# Patient Record
Sex: Female | Born: 1965 | Race: Black or African American | Hispanic: No | Marital: Married | Smoking: Never smoker
Health system: Southern US, Community
[De-identification: ages and names within clinical notes are randomized; demographics above are authoritative.]

## PROBLEM LIST (undated history)

## (undated) DIAGNOSIS — I10 Essential (primary) hypertension: Secondary | ICD-10-CM

## (undated) DIAGNOSIS — K8020 Calculus of gallbladder without cholecystitis without obstruction: Secondary | ICD-10-CM

## (undated) DIAGNOSIS — E785 Hyperlipidemia, unspecified: Secondary | ICD-10-CM

## (undated) HISTORY — PX: UTERINE FIBROID SURGERY: SHX826

## (undated) HISTORY — PX: TUBAL LIGATION: SHX77

---

## 2005-10-24 ENCOUNTER — Emergency Department: Payer: Self-pay | Admitting: Emergency Medicine

## 2011-02-12 ENCOUNTER — Ambulatory Visit: Payer: Self-pay | Admitting: Family Medicine

## 2011-03-11 ENCOUNTER — Ambulatory Visit: Payer: Self-pay | Admitting: Family Medicine

## 2011-04-23 ENCOUNTER — Ambulatory Visit: Payer: Self-pay | Admitting: Family Medicine

## 2011-04-30 ENCOUNTER — Ambulatory Visit: Payer: Self-pay

## 2012-12-30 ENCOUNTER — Ambulatory Visit: Payer: Self-pay | Admitting: Family Medicine

## 2013-05-22 ENCOUNTER — Emergency Department: Payer: Self-pay | Admitting: Emergency Medicine

## 2013-05-22 LAB — CBC
HGB: 12.8 g/dL (ref 12.0–16.0)
MCH: 26 pg (ref 26.0–34.0)
MCHC: 32.9 g/dL (ref 32.0–36.0)
Platelet: 340 10*3/uL (ref 150–440)
RBC: 4.92 10*6/uL (ref 3.80–5.20)
RDW: 14.8 % — ABNORMAL HIGH (ref 11.5–14.5)

## 2013-05-22 LAB — TROPONIN I: Troponin-I: <0.02 ng/mL

## 2013-05-22 LAB — COMPREHENSIVE METABOLIC PANEL
Anion Gap: 6 — ABNORMAL LOW (ref 7–16)
Bilirubin,Total: 0.4 mg/dL (ref 0.2–1.0)
Co2: 27 mmol/L (ref 21–32)
EGFR (African American): >60
EGFR (Non-African Amer.): >60
Potassium: 3.6 mmol/L (ref 3.5–5.1)
SGOT(AST): 15 U/L (ref 15–37)
Total Protein: 7.7 g/dL (ref 6.4–8.2)

## 2013-05-23 LAB — URINALYSIS, COMPLETE
Bilirubin,UR: NEGATIVE
Glucose,UR: NEGATIVE mg/dL (ref 0–75)
Specific Gravity: 1.014 (ref 1.003–1.030)
WBC UR: 11 /HPF (ref 0–5)

## 2014-03-08 ENCOUNTER — Ambulatory Visit: Payer: Self-pay | Admitting: Family Medicine

## 2015-03-13 ENCOUNTER — Other Ambulatory Visit: Payer: Self-pay | Admitting: Family Medicine

## 2015-03-13 DIAGNOSIS — Z1231 Encounter for screening mammogram for malignant neoplasm of breast: Secondary | ICD-10-CM

## 2015-03-14 ENCOUNTER — Ambulatory Visit
Admission: RE | Admit: 2015-03-14 | Discharge: 2015-03-14 | Disposition: A | Discharge status: Home / Self Care | Payer: BC Managed Care – PPO | Source: Ambulatory Visit | Attending: Family Medicine | Admitting: Family Medicine

## 2015-03-14 DIAGNOSIS — Z1231 Encounter for screening mammogram for malignant neoplasm of breast: Secondary | ICD-10-CM | POA: Diagnosis not present

## 2016-02-04 ENCOUNTER — Other Ambulatory Visit: Payer: Self-pay | Admitting: Family Medicine

## 2016-02-04 DIAGNOSIS — Z1231 Encounter for screening mammogram for malignant neoplasm of breast: Secondary | ICD-10-CM

## 2016-05-22 ENCOUNTER — Ambulatory Visit
Admission: RE | Admit: 2016-05-22 | Discharge: 2016-05-22 | Disposition: A | Discharge status: Home / Self Care | Payer: BC Managed Care – PPO | Source: Ambulatory Visit | Attending: Family Medicine | Admitting: Family Medicine

## 2016-05-22 DIAGNOSIS — Z1231 Encounter for screening mammogram for malignant neoplasm of breast: Secondary | ICD-10-CM

## 2017-06-01 ENCOUNTER — Other Ambulatory Visit: Payer: Self-pay | Admitting: Family Medicine

## 2017-06-01 DIAGNOSIS — Z1231 Encounter for screening mammogram for malignant neoplasm of breast: Secondary | ICD-10-CM

## 2017-06-02 ENCOUNTER — Ambulatory Visit
Admission: RE | Admit: 2017-06-02 | Discharge: 2017-06-02 | Disposition: A | Discharge status: Home / Self Care | Payer: BC Managed Care – PPO | Source: Ambulatory Visit | Attending: Family Medicine | Admitting: Family Medicine

## 2017-06-02 DIAGNOSIS — Z1231 Encounter for screening mammogram for malignant neoplasm of breast: Secondary | ICD-10-CM | POA: Insufficient documentation

## 2017-09-07 ENCOUNTER — Emergency Department
Admission: EM | Admit: 2017-09-07 | Discharge: 2017-09-08 | Disposition: A | Discharge status: Home / Self Care | Payer: BC Managed Care – PPO | Attending: Emergency Medicine | Admitting: Emergency Medicine

## 2017-09-07 ENCOUNTER — Emergency Department: Payer: BC Managed Care – PPO

## 2017-09-07 DIAGNOSIS — R079 Chest pain, unspecified: Secondary | ICD-10-CM | POA: Insufficient documentation

## 2017-09-07 HISTORY — DX: Calculus of gallbladder without cholecystitis without obstruction: K80.20

## 2017-09-07 LAB — BASIC METABOLIC PANEL
Anion gap: 9 (ref 5–15)
BUN: 13 mg/dL (ref 6–20)
CHLORIDE: 102 mmol/L (ref 101–111)
CO2: 27 mmol/L (ref 22–32)
CREATININE: 0.85 mg/dL (ref 0.44–1.00)
Calcium: 8.3 mg/dL — ABNORMAL LOW (ref 8.9–10.3)
GFR calc Af Amer: >60 mL/min (ref >60)
GFR calc non Af Amer: >60 mL/min (ref >60)
Glucose, Bld: 104 mg/dL — ABNORMAL HIGH (ref 65–99)
POTASSIUM: 2.9 mmol/L — AB (ref 3.5–5.1)
SODIUM: 138 mmol/L (ref 135–145)

## 2017-09-07 LAB — TROPONIN I: Troponin I: <0.03 ng/mL (ref <0.03)

## 2017-09-07 LAB — CBC
HEMATOCRIT: 39 % (ref 35.0–47.0)
Hemoglobin: 12.8 g/dL (ref 12.0–16.0)
MCH: 27 pg (ref 26.0–34.0)
MCHC: 32.9 g/dL (ref 32.0–36.0)
MCV: 82.1 fL (ref 80.0–100.0)
PLATELETS: 348 10*3/uL (ref 150–440)
RBC: 4.75 MIL/uL (ref 3.80–5.20)
RDW: 14.8 % — ABNORMAL HIGH (ref 11.5–14.5)
WBC: 8.9 10*3/uL (ref 3.6–11.0)

## 2017-09-07 MED ORDER — ASPIRIN 81 MG PO CHEW
324.0000 mg | CHEWABLE_TABLET | Freq: Once | ORAL | Status: AC
  Administered 2017-09-07: 324 mg via ORAL
  Filled 2017-09-07: qty 4

## 2017-09-07 MED ORDER — GI COCKTAIL ~~LOC~~
30.0000 mL | Freq: Once | ORAL | Status: DC
  Filled 2017-09-07: qty 30

## 2017-09-07 MED ORDER — NITROGLYCERIN 0.4 MG SL SUBL
0.4000 mg | SUBLINGUAL_TABLET | SUBLINGUAL | Status: DC | PRN
  Administered 2017-09-07 – 2017-09-08 (×2): 0.4 mg via SUBLINGUAL
  Filled 2017-09-07: qty 1

## 2017-09-07 NOTE — ED Triage Notes (Signed)
Patient c/o medial chest pain, radiating to right arm beginng approx 2 hours ago. Patient describes the pain as pressure. Patient reports associated symptoms of SOB, and weakness.

## 2017-09-07 NOTE — ED Provider Notes (Signed)
[facility] Emergency Department Provider Note   ____________________________________________   First MD Initiated Contact with Patient 09/07/17 2304     (approximate)  I have reviewed the triage vital signs and the nursing notes.   HISTORY  Chief Complaint Chest Pain    HPI Joy Dunlap is a 52 y.o. female who comes into the hospital today with some chest pains.  The patient states that it started around 9 PM.  She was not doing anything just sitting.  The pain is on the right side of her chest and goes under her breasts.  Occasionally goes to her arm into her back.  The patient has never had this before.  She reports that it feels sharp and dull.  Nothing seems to make it better or worse but the patient did not take anything at home.  She states the will calm down and then become sharp again.  Her pain is currently a 6 out of 10 in intensity.  The patient had some shortness of breath but denies any pain with deep inspiration.  She has had no nausea no vomiting no sweats.  She has had some mild lightheadedness.  The patient is here today for evaluation of this pain.   Past Medical History:  Diagnosis Date  . Gallstones     There are no active problems to display for this patient.   History reviewed. No pertinent surgical history.  Prior to Admission medications   Not on File    Allergies Flagyl [metronidazole]  Family History  Problem Relation Age of Onset  . Breast cancer Maternal Aunt 62  . Breast cancer Cousin        mat cousin    Social History Social History   Tobacco Use  . Smoking status: Never Smoker  . Smokeless tobacco: Never Used  Substance Use Topics  . Alcohol use: No    Frequency: Never  . Drug use: No    Review of Systems  Constitutional: No fever/chills Eyes: No visual changes. ENT: No sore throat. Cardiovascular:  chest pain. Respiratory:  shortness of breath. Gastrointestinal: No abdominal pain.  No  nausea, no vomiting.  No diarrhea.  No constipation. Genitourinary: Negative for dysuria. Musculoskeletal: Negative for back pain. Skin: Negative for rash. Neurological: Lightheadedness  ____________________________________________   PHYSICAL EXAM:  VITAL SIGNS: ED Triage Vitals  Enc Vitals Group     BP 09/07/17 2223 (!) 200/118     Pulse Rate 09/07/17 2223 93     Resp --      Temp 09/07/17 2223 100.1 F (37.8 C)     Temp Source 09/07/17 2223 Oral     SpO2 09/07/17 2223 97 %     Weight 09/07/17 2221 230 lb (104.3 kg)     Height 09/07/17 2221 5\' 6"  (1.676 m)     Head Circumference --      Peak Flow --      Pain Score 09/07/17 2221 6     Pain Loc --      Pain Edu? --      Excl. in [place]? --     Constitutional: Alert and oriented. Well appearing and in moderate distress. Eyes: Conjunctivae are normal. PERRL. EOMI. Head: Atraumatic. Nose: No congestion/rhinnorhea. Mouth/Throat: Mucous membranes are moist.  Oropharynx non-erythematous. Cardiovascular: Normal rate, regular rhythm. Grossly normal heart sounds.  Good peripheral circulation. Respiratory: Normal respiratory effort.  No retractions. Lungs CTAB. Gastrointestinal: Soft and nontender. No distention.  Positive bowel sounds Musculoskeletal:  No lower extremity tenderness nor edema.   Neurologic:  Normal speech and language.  Skin:  Skin is warm, dry and intact.  Psychiatric: Mood and affect are normal.   ____________________________________________   LABS (all labs ordered are listed, but only abnormal results are displayed)  Labs Reviewed  BASIC METABOLIC PANEL - Abnormal; Notable for the following components:      Result Value   Potassium 2.9 (*)    Glucose, Bld 104 (*)    Calcium 8.3 (*)    All other components within normal limits  CBC - Abnormal; Notable for the following components:   RDW 14.8 (*)    All other components within normal limits  TROPONIN I  TROPONIN I  POCT PREGNANCY, URINE  POC URINE  PREG, ED   ____________________________________________  EKG  ED ECG REPORT I, Loney Hering, the attending physician, personally viewed and interpreted this ECG.   Date: 09/07/2017  EKG Time: 2222  Rate: 84  Rhythm: normal sinus rhythm  Axis: normal  Intervals:none  ST&T Change: diffuse t wave flattening  ____________________________________________  RADIOLOGY  ED MD interpretation: Chest x-ray: Negative  Official radiology report(s): Dg Chest 2 View  Result Date: 09/07/2017 CLINICAL DATA:  Acute onset of central chest pain, radiating to the right arm. Shortness of breath and generalized weakness. EXAM: CHEST  2 VIEW COMPARISON:  None. FINDINGS: The lungs are well-aerated and clear. There is no evidence of focal opacification, pleural effusion or pneumothorax. The heart is normal in size; the mediastinal contour is within normal limits. No acute osseous abnormalities are seen. Clips are noted within the right upper quadrant, reflecting prior cholecystectomy. IMPRESSION: No acute cardiopulmonary process seen. Electronically Signed   By: Garald Balding M.D.   On: 09/07/2017 22:41    ____________________________________________   PROCEDURES  Procedure(s) performed: None  Procedures  Critical Care performed: No  ____________________________________________   INITIAL IMPRESSION / ASSESSMENT AND PLAN / ED COURSE  As part of my medical decision making, I reviewed the following data within the electronic MEDICAL RECORD NUMBER Notes from prior ED visits and [place] Controlled Substance Database   This is a 52 year old female who comes into the hospital today with some chest pain shortness of breath and lightheadedness.  My differential diagnosis includes acute coronary syndrome, hypertensive urgency, noncardiac chest pain  I did give the patient some nitroglycerin and aspirin.  We did check some blood work on the patient.  Her initial troponin and CBC were unremarkable.  The  patient's K returned at 2.9.  The patient also had a chest x-ray which was negative.  I will reassess the patient and repeat 3-hour troponin.  The patient's blood pressure was elevated but she does not admit to having problems with her blood pressure.  The patient will be reassessed.  Clinical Course as of Sep 08 318  Tue Sep 08, 2017  0036 His pain is improved.  Her blood pressure is also improved.  We will reassess the patient and repeat her troponin in approximately 30 minutes.  [AW]    Clinical Course User Index [AW] Loney Hering, MD    Patient's pain is still improved.  Her repeat troponin was negative.  The patient will be discharged home to follow-up with cardiology. ____________________________________________   FINAL CLINICAL IMPRESSION(S) / ED DIAGNOSES  Final diagnoses:  Chest pain, unspecified type     ED Discharge Orders    None       Note:  This document was prepared  using Systems analyst and may include unintentional dictation errors.    Loney Hering, MD 09/08/17 [phone]

## 2017-09-07 NOTE — ED Notes (Signed)
Pt states she can not tolerate the GI cocktail, pt tasted medication and began to dry heave. Pt states she is not able to drink the rest of the medication.

## 2017-09-08 ENCOUNTER — Telehealth: Payer: Self-pay

## 2017-09-08 DIAGNOSIS — R079 Chest pain, unspecified: Secondary | ICD-10-CM | POA: Diagnosis not present

## 2017-09-08 LAB — TROPONIN I: Troponin I: <0.03 ng/mL (ref <0.03)

## 2017-09-08 LAB — POCT PREGNANCY, URINE: Preg Test, Ur: NEGATIVE

## 2017-09-08 MED ORDER — POTASSIUM CHLORIDE CRYS ER 20 MEQ PO TBCR
40.0000 meq | EXTENDED_RELEASE_TABLET | Freq: Once | ORAL | Status: AC
Start: 2017-09-08 — End: 2017-09-08
  Administered 2017-09-08: 40 meq via ORAL
  Filled 2017-09-08: qty 2

## 2017-09-08 NOTE — Discharge Instructions (Signed)
Please follow up with cardiology for further re evaluation of your chest pain as well as evaluation of your blood pressure.

## 2017-09-08 NOTE — ED Notes (Signed)

## 2017-09-08 NOTE — Telephone Encounter (Signed)
Called patient she was seen in ED 09/07/17 for CP  She states she does not want to schedule an FU appointment at this time She will see PCP first then call us if needed.   Nothing else needed.

## 2018-06-28 ENCOUNTER — Other Ambulatory Visit: Payer: Self-pay | Admitting: Family Medicine

## 2018-06-28 DIAGNOSIS — Z1231 Encounter for screening mammogram for malignant neoplasm of breast: Secondary | ICD-10-CM

## 2018-07-06 ENCOUNTER — Encounter (INDEPENDENT_AMBULATORY_CARE_PROVIDER_SITE_OTHER): Payer: Self-pay

## 2018-07-06 ENCOUNTER — Ambulatory Visit
Admission: RE | Admit: 2018-07-06 | Discharge: 2018-07-06 | Disposition: A | Discharge status: Home / Self Care | Payer: BC Managed Care – PPO | Source: Ambulatory Visit | Attending: Family Medicine | Admitting: Family Medicine

## 2018-07-06 DIAGNOSIS — Z1231 Encounter for screening mammogram for malignant neoplasm of breast: Secondary | ICD-10-CM | POA: Diagnosis present

## 2018-09-14 ENCOUNTER — Observation Stay
Admission: EM | Admit: 2018-09-14 | Discharge: 2018-09-18 | Disposition: A | Discharge status: Home / Self Care | Payer: BC Managed Care – PPO | Attending: Internal Medicine | Admitting: Internal Medicine

## 2018-09-14 ENCOUNTER — Encounter: Payer: Self-pay | Admitting: Emergency Medicine

## 2018-09-14 ENCOUNTER — Emergency Department: Payer: BC Managed Care – PPO

## 2018-09-14 ENCOUNTER — Other Ambulatory Visit: Payer: Self-pay

## 2018-09-14 DIAGNOSIS — Z7951 Long term (current) use of inhaled steroids: Secondary | ICD-10-CM | POA: Insufficient documentation

## 2018-09-14 DIAGNOSIS — R109 Unspecified abdominal pain: Secondary | ICD-10-CM

## 2018-09-14 DIAGNOSIS — E669 Obesity, unspecified: Secondary | ICD-10-CM | POA: Diagnosis not present

## 2018-09-14 DIAGNOSIS — R1084 Generalized abdominal pain: Principal | ICD-10-CM

## 2018-09-14 DIAGNOSIS — I776 Arteritis, unspecified: Secondary | ICD-10-CM

## 2018-09-14 DIAGNOSIS — E785 Hyperlipidemia, unspecified: Secondary | ICD-10-CM | POA: Diagnosis not present

## 2018-09-14 DIAGNOSIS — Z86010 Personal history of colonic polyps: Secondary | ICD-10-CM | POA: Insufficient documentation

## 2018-09-14 DIAGNOSIS — Z9049 Acquired absence of other specified parts of digestive tract: Secondary | ICD-10-CM | POA: Insufficient documentation

## 2018-09-14 DIAGNOSIS — R112 Nausea with vomiting, unspecified: Secondary | ICD-10-CM | POA: Diagnosis not present

## 2018-09-14 DIAGNOSIS — R8281 Pyuria: Secondary | ICD-10-CM | POA: Insufficient documentation

## 2018-09-14 DIAGNOSIS — K2950 Unspecified chronic gastritis without bleeding: Secondary | ICD-10-CM | POA: Diagnosis not present

## 2018-09-14 DIAGNOSIS — B9681 Helicobacter pylori [H. pylori] as the cause of diseases classified elsewhere: Secondary | ICD-10-CM | POA: Insufficient documentation

## 2018-09-14 DIAGNOSIS — Z79899 Other long term (current) drug therapy: Secondary | ICD-10-CM | POA: Diagnosis not present

## 2018-09-14 DIAGNOSIS — Z87898 Personal history of other specified conditions: Secondary | ICD-10-CM | POA: Insufficient documentation

## 2018-09-14 DIAGNOSIS — Z6837 Body mass index (BMI) 37.0-37.9, adult: Secondary | ICD-10-CM | POA: Diagnosis not present

## 2018-09-14 DIAGNOSIS — I10 Essential (primary) hypertension: Secondary | ICD-10-CM | POA: Diagnosis not present

## 2018-09-14 DIAGNOSIS — K319 Disease of stomach and duodenum, unspecified: Secondary | ICD-10-CM | POA: Insufficient documentation

## 2018-09-14 DIAGNOSIS — K317 Polyp of stomach and duodenum: Secondary | ICD-10-CM

## 2018-09-14 DIAGNOSIS — K5900 Constipation, unspecified: Secondary | ICD-10-CM | POA: Diagnosis not present

## 2018-09-14 DIAGNOSIS — R3129 Other microscopic hematuria: Secondary | ICD-10-CM | POA: Diagnosis not present

## 2018-09-14 HISTORY — DX: Hyperlipidemia, unspecified: E78.5

## 2018-09-14 HISTORY — DX: Essential (primary) hypertension: I10

## 2018-09-14 LAB — COMPREHENSIVE METABOLIC PANEL
ALBUMIN: 3.9 g/dL (ref 3.5–5.0)
ALK PHOS: 62 U/L (ref 38–126)
ALT: 18 U/L (ref 0–44)
AST: 16 U/L (ref 15–41)
Anion gap: 10 (ref 5–15)
BILIRUBIN TOTAL: 0.5 mg/dL (ref 0.3–1.2)
BUN: 14 mg/dL (ref 6–20)
CALCIUM: 8.7 mg/dL — AB (ref 8.9–10.3)
CO2: 27 mmol/L (ref 22–32)
Chloride: 103 mmol/L (ref 98–111)
Creatinine, Ser: 0.99 mg/dL (ref 0.44–1.00)
GFR calc Af Amer: >60 mL/min (ref >60)
GFR calc non Af Amer: >60 mL/min (ref >60)
GLUCOSE: 112 mg/dL — AB (ref 70–99)
Potassium: 3.8 mmol/L (ref 3.5–5.1)
Sodium: 140 mmol/L (ref 135–145)
TOTAL PROTEIN: 7.7 g/dL (ref 6.5–8.1)

## 2018-09-14 LAB — CBC WITH DIFFERENTIAL/PLATELET
Abs Immature Granulocytes: 0.02 K/uL (ref 0.00–0.07)
Basophils Absolute: 0 K/uL (ref 0.0–0.1)
Basophils Relative: 0 %
Eosinophils Absolute: 0.2 K/uL (ref 0.0–0.5)
Eosinophils Relative: 2 %
HCT: 40.9 % (ref 36.0–46.0)
Hemoglobin: 12.8 g/dL (ref 12.0–15.0)
Immature Granulocytes: 0 %
Lymphocytes Relative: 20 %
Lymphs Abs: 2.1 K/uL (ref 0.7–4.0)
MCH: 26.3 pg (ref 26.0–34.0)
MCHC: 31.3 g/dL (ref 30.0–36.0)
MCV: 84 fL (ref 80.0–100.0)
Monocytes Absolute: 0.5 K/uL (ref 0.1–1.0)
Monocytes Relative: 5 %
Neutro Abs: 7.8 K/uL — ABNORMAL HIGH (ref 1.7–7.7)
Neutrophils Relative %: 73 %
Platelets: 375 K/uL (ref 150–400)
RBC: 4.87 MIL/uL (ref 3.87–5.11)
RDW: 14 % (ref 11.5–15.5)
WBC: 10.6 K/uL — ABNORMAL HIGH (ref 4.0–10.5)
nRBC: 0 % (ref 0.0–0.2)

## 2018-09-14 LAB — URINALYSIS, COMPLETE (UACMP) WITH MICROSCOPIC
Bilirubin Urine: NEGATIVE
Glucose, UA: NEGATIVE mg/dL
Ketones, ur: NEGATIVE mg/dL
Nitrite: NEGATIVE
Protein, ur: NEGATIVE mg/dL
Specific Gravity, Urine: 1.015 (ref 1.005–1.030)
pH: 6 (ref 5.0–8.0)

## 2018-09-14 LAB — LIPASE, BLOOD: Lipase: 34 U/L (ref 11–51)

## 2018-09-14 LAB — POCT PREGNANCY, URINE: Preg Test, Ur: NEGATIVE

## 2018-09-14 MED ORDER — ONDANSETRON HCL 4 MG/2ML IJ SOLN
4.0000 mg | Freq: Once | INTRAMUSCULAR | Status: AC | PRN
  Administered 2018-09-14: 4 mg via INTRAVENOUS
  Filled 2018-09-14: qty 2

## 2018-09-14 MED ORDER — IOPAMIDOL (ISOVUE-370) INJECTION 76%
125.0000 mL | Freq: Once | INTRAVENOUS | Status: AC | PRN
  Administered 2018-09-14: 125 mL via INTRAVENOUS

## 2018-09-14 MED ORDER — FENTANYL CITRATE (PF) 100 MCG/2ML IJ SOLN
50.0000 ug | INTRAMUSCULAR | Status: DC | PRN
  Administered 2018-09-14: 50 ug via INTRAVENOUS
  Filled 2018-09-14: qty 2

## 2018-09-14 NOTE — ED Provider Notes (Signed)
[facility] Emergency Department Provider Note _______________________   First MD Initiated Contact with Patient 09/14/18 2339     (approximate)  I have reviewed the triage vital signs and the nursing notes.   HISTORY  Chief Complaint Abdominal Pain    HPI Joy Dunlap is a 53 y.o. female with below list of chronic medical conditions including hypertension and gallstones presents to the emergency department with 10 out of 10 epigastric abdominal pain with associated nausea which began yesterday.  Patient states that pain is progressively worsened.  Patient denies any vomiting however does admit to retching.  Patient denies any diarrhea no constipation.  Patient denies any fever.  Patient denies any urinary symptoms.   Past Medical History:  Diagnosis Date  . Gallstones   . Hypertension     Patient Active Problem List   Diagnosis Date Noted  . Abdominal pain 09/15/2018  . HTN (hypertension) 09/15/2018    Past Surgical History:  Procedure Laterality Date  . TUBAL LIGATION    . UTERINE FIBROID SURGERY      Prior to Admission medications   Medication Sig Start Date End Date Taking? Authorizing Provider  amLODipine (NORVASC) 5 MG tablet Take 5 mg by mouth daily. 06/16/18 06/16/19 Yes [provider]  atorvastatin (LIPITOR) 10 MG tablet Take 10 mg by mouth daily. 07/08/18 07/08/19 Yes [provider]  fluticasone (FLONASE) 50 MCG/ACT nasal spray Place 2 sprays into the nose daily. 07/07/18 07/07/19 Yes [provider]  ipratropium (ATROVENT) 0.03 % nasal spray Place 2 sprays into the nose 3 (three) times daily as needed. 06/04/18 06/04/19 Yes [provider]  triamterene-hydrochlorothiazide (DYAZIDE) 37.5-25 MG capsule Take 1 capsule by mouth every morning. 06/16/18 06/16/19 Yes [provider]    Allergies Flagyl [metronidazole]  Family History  Problem Relation Age of Onset  . Breast cancer  Maternal Aunt 62  . Breast cancer Cousin        2 mat cousins    Social History Social History   Tobacco Use  . Smoking status: Never Smoker  . Smokeless tobacco: Never Used  Substance Use Topics  . Alcohol use: No    Frequency: Never  . Drug use: No    Review of Systems Constitutional: No fever/chills Eyes: No visual changes. ENT: No sore throat. Cardiovascular: Denies chest pain. Respiratory: Denies shortness of breath. Gastrointestinal: Positive for abdominal pain and nausea, no vomiting.  No diarrhea.  No constipation. Genitourinary: Negative for dysuria. Musculoskeletal: Negative for neck pain.  Negative for back pain. Integumentary: Negative for rash. Neurological: Negative for headaches, focal weakness or numbness.   ____________________________________________   PHYSICAL EXAM:  VITAL SIGNS: ED Triage Vitals  Enc Vitals Group     BP 09/14/18 2119 (!) 175/95     Pulse Rate 09/14/18 2119 87     Resp 09/14/18 2119 20     Temp 09/14/18 2119 100.1 F (37.8 C)     Temp Source 09/14/18 2119 Oral     SpO2 09/14/18 2119 95 %     Weight 09/14/18 2118 104.3 kg (230 lb)     Height 09/14/18 2118 1.676 m (5\' 6" )     Head Circumference --      Peak Flow --      Pain Score 09/14/18 2118 10     Pain Loc --      Pain Edu? --      Excl. in [place]? --     Constitutional: Alert and oriented.  apparent discomfort  eyes: Conjunctivae are normal.  Mouth/Throat: Mucous membranes are moist.  Oropharynx non-erythematous. Neck: No stridor.  Cardiovascular: Normal rate, regular rhythm. Good peripheral circulation. Grossly normal heart sounds. Respiratory: Normal respiratory effort.  No retractions. Lungs CTAB. Gastrointestinal: Epigastric discomfort with palpation.. No distention.  Musculoskeletal: No lower extremity tenderness nor edema. No gross deformities of extremities. Neurologic:  Normal speech and language. No gross focal neurologic deficits are appreciated.  Skin:  Skin  is warm, dry and intact. No rash noted. Psychiatric: Mood and affect are normal. Speech and behavior are normal.  ____________________________________________   LABS (all labs ordered are listed, but only abnormal results are displayed)  Labs Reviewed  CBC WITH DIFFERENTIAL/PLATELET - Abnormal; Notable for the following components:      Result Value   WBC 10.6 (*)    Neutro Abs 7.8 (*)    All other components within normal limits  COMPREHENSIVE METABOLIC PANEL - Abnormal; Notable for the following components:   Glucose, Bld 112 (*)    Calcium 8.7 (*)    All other components within normal limits  URINALYSIS, COMPLETE (UACMP) WITH MICROSCOPIC - Abnormal; Notable for the following components:   Color, Urine YELLOW (*)    APPearance CLEAR (*)    Hgb urine dipstick MODERATE (*)    Leukocytes,Ua TRACE (*)    Bacteria, UA RARE (*)    All other components within normal limits  CULTURE, BLOOD (ROUTINE X 2)  CULTURE, BLOOD (ROUTINE X 2)  LIPASE, BLOOD  POCT PREGNANCY, URINE   ____________________  RADIOLOGY I, [place] N Takyia Sindt, personally viewed and evaluated these images (plain radiographs) as part of my medical decision making, as well as reviewing the written report by the radiologist.  ED MD interpretation: CT renal study performed before my evaluation revealed mild stranding surrounding the origins of the celiac axis and superior mesenteric artery may reflect mild vasculitis with recommendation for CT angiogram per radiologist.  CT angiogram findings consistent with that of the previous CT revealing patent vessels however persistent inflammatory stranding along the ventral aorta at the SMA origin mildly present at the celiac axis and renal origins.  Official radiology report(s): Ct Renal Stone Study  Result Date: 09/14/2018 CLINICAL DATA:  Hematuria with abdominal pain EXAM: CT ABDOMEN AND PELVIS WITHOUT CONTRAST TECHNIQUE: Multidetector CT imaging of the abdomen and pelvis was  performed following the standard protocol without IV contrast. COMPARISON:  CT abdomen pelvis 10/24/2005 FINDINGS: LOWER CHEST: There is no basilar pleural or apical pericardial effusion. HEPATOBILIARY: 2.8 cm cyst in the left hepatic lobe, increased in size from prior examination. No other focal liver lesions. Normal contours. Status post cholecystectomy. PANCREAS: The pancreatic parenchymal contours are normal and there is no ductal dilatation. There is no peripancreatic fluid collection. SPLEEN: Normal. ADRENALS/URINARY TRACT: --Adrenal glands: Normal. --Right kidney/ureter: No hydronephrosis, nephroureterolithiasis, perinephric stranding or solid renal mass. --Left kidney/ureter: No hydronephrosis, nephroureterolithiasis, perinephric stranding or solid renal mass. --Urinary bladder: Normal for degree of distention STOMACH/BOWEL: --Stomach/Duodenum: There is no hiatal hernia or other gastric abnormality. The duodenal course and caliber are normal. --Small bowel: No dilatation or inflammation. --Colon: No focal abnormality. --Appendix: Normal. VASCULAR/LYMPHATIC: There is mild stranding surrounding the origins of the celiac axis and superior mesenteric artery. Otherwise normal appearance of the major abdominal vessels. No abdominal or pelvic lymphadenopathy. REPRODUCTIVE: Multiple partially calcified uterine fibroids, measuring up to 3 cm. Calcification is new compared to the prior CT. Normal adnexa. MUSCULOSKELETAL. No bony spinal canal stenosis or focal osseous abnormality.  OTHER: None. IMPRESSION: 1. Mild stranding surrounding the origins of the celiac axis and superior mesenteric artery, which may indicate a mild vasculitis. Vascular patency could be confirmed with CT angiography. 2. No obstructive uropathy or nephrolithiasis. 3. Multiple calcified uterine fibroids. Electronically Signed   By: Ulyses Jarred M.D.   On: 09/14/2018 23:09   Ct Angio Abd/pel W And/or Wo Contrast  Result Date:  09/15/2018 CLINICAL DATA:  52 year old female with abnormal noncontrast CT Abdomen and Pelvis earlier. Hematuria and abdominal pain. Query celiac and SMA vasculitis. EXAM: CTA ABDOMEN AND PELVIS wITHOUT AND WITH CONTRAST TECHNIQUE: Multidetector CT imaging of the abdomen and pelvis was performed using the standard protocol during bolus administration of intravenous contrast. Multiplanar reconstructed images and MIPs were obtained and reviewed to evaluate the vascular anatomy. CONTRAST:  161mL ISOVUE-370 IOPAMIDOL (ISOVUE-370) INJECTION 76% COMPARISON:  CT Abdomen and Pelvis 09/14/2018. FINDINGS: VASCULAR Aorta: Patent without atherosclerosis, stenosis, wall thickening, aneurysm or dissection. Celiac: Patent without atherosclerosis or stenosis. Mild fat stranding about the celiac trunk. SMA: More moderate fat stranding about the proximal SMA which is mildly narrow it but remains patent (series 8, image 71 and series 9, image 120. No wall thickening. No atherosclerosis identified. SMA branches appear patent. Renals: Patent. Mildly bordered by the fat stranding around the SMA. IMA: Patent and within normal limits. Bilateral iliac arteries and proximal femoral arteries appear normal. Veins: On delayed images the portal venous system is patent. No abnormality of the SMV is identified. Review of the MIP images confirms the above findings. NON-VASCULAR Lower chest: Stable mild cardiomegaly and lung base atelectasis. Hepatobiliary: Simple fluid density central hepatic cyst. Surgically absent gallbladder. Pancreas: Pancreatic enhancement is within normal limits. Spleen: Negative. Adrenals/Urinary Tract: Normal adrenal glands. Bilateral renal enhancement is symmetric and within normal limits. Diminutive and unremarkable urinary bladder. Stomach/Bowel: Stable. Diverticulosis of the descending and sigmoid colon without active inflammation. Retained stool elsewhere. Negative retrocecal appendix. No dilated small bowel no free  air, free fluid. Lymphatic: No lymphadenopathy. Reproductive: Stable, multiple calcified fibroids. Other: No pelvic free fluid. Musculoskeletal: No acute osseous abnormality identified. Mild chronic appearing trauma to the anterior left pubic symphysis with dystrophic calcification of the muscle. IMPRESSION: VASCULAR 1. Persistent inflammatory stranding along the ventral Aorta at the SMA origin, mildly present at the celiac and renal origins. No Aortic or SMA wall thickening, dissection, atherosclerosis, or occlusion. 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 Early retroperitoneal fibrosis is felt less likely. 2. SMV and portal venous system appear normal. NON-VASCULAR Stable from the earlier CT Abdomen and Pelvis.  No new abnormality. Electronically Signed   By: Genevie Ann M.D.   On: 09/15/2018 00:47      Procedures   ____________________________________________   INITIAL IMPRESSION / ASSESSMENT AND PLAN / ED COURSE  As part of my medical decision making, I reviewed the following data within the electronic MEDICAL RECORD NUMBER   53 year old female presented with above-stated history and physical exam secondary to epigastric abdominal discomfort with associated nausea.  CT scan performed to evaluate for intra-abdominal pathology including diverticulitis gastritis gastric ulcer etc. 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  Patient given Decadron 20 mg IV.  Patient also received multiple doses of IV morphine as pain reoccurred after the initial dose.  Patient discussed with Dr. Lucky Cowboy who agreed with [facility] thus far including IV Decadron.  Dr. 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[place])     MEDICATIONS GIVEN DURING THIS VISIT:  Medications  fentaNYL  (SUBLIMAZE) injection 50 mcg (50 mcg Intravenous Given 09/14/18 2231)  ondansetron (ZOFRAN) injection 4 mg (4 mg Intravenous Given 09/14/18 2230)  iopamidol (ISOVUE-370) 76 % injection 125 mL (125 mLs Intravenous Contrast Given 09/14/18 2359)  dexamethasone (DECADRON) injection 20 mg (20 mg Intravenous Given 09/15/18 0135)  morphine 2 MG/ML injection 2 mg (2 mg Intravenous Given 09/15/18 0134)     ED Discharge Orders    None       Note:  This document was prepared using Dragon voice recognition software and may include unintentional dictation errors.   Gregor Hams, MD 09/15/18 [phone]

## 2018-09-14 NOTE — ED Notes (Signed)
Pt presentation discussed with Burlene Arnt, MD. Order received for renal CT.

## 2018-09-14 NOTE — ED Triage Notes (Addendum)
Patient ambulatory to triage with steady gait, without difficulty, appears uncomfortable; pt reports since yesterday having lower abd pain, nonradiating accomp by nausea; denies hx of same

## 2018-09-15 ENCOUNTER — Encounter: Payer: Self-pay | Admitting: Internal Medicine

## 2018-09-15 ENCOUNTER — Other Ambulatory Visit: Payer: Self-pay

## 2018-09-15 DIAGNOSIS — R1084 Generalized abdominal pain: Secondary | ICD-10-CM | POA: Diagnosis not present

## 2018-09-15 DIAGNOSIS — D259 Leiomyoma of uterus, unspecified: Secondary | ICD-10-CM

## 2018-09-15 DIAGNOSIS — I10 Essential (primary) hypertension: Secondary | ICD-10-CM

## 2018-09-15 DIAGNOSIS — E785 Hyperlipidemia, unspecified: Secondary | ICD-10-CM | POA: Diagnosis present

## 2018-09-15 DIAGNOSIS — I776 Arteritis, unspecified: Secondary | ICD-10-CM | POA: Diagnosis not present

## 2018-09-15 LAB — SEDIMENTATION RATE: Sed Rate: 41 mm/hr — ABNORMAL HIGH (ref 0–30)

## 2018-09-15 LAB — CBC
HCT: 40.5 % (ref 36.0–46.0)
Hemoglobin: 12.8 g/dL (ref 12.0–15.0)
MCH: 26.5 pg (ref 26.0–34.0)
MCHC: 31.6 g/dL (ref 30.0–36.0)
MCV: 83.9 fL (ref 80.0–100.0)
Platelets: 380 10*3/uL (ref 150–400)
RBC: 4.83 MIL/uL (ref 3.87–5.11)
RDW: 14.1 % (ref 11.5–15.5)
WBC: 7.5 10*3/uL (ref 4.0–10.5)
nRBC: 0 % (ref 0.0–0.2)

## 2018-09-15 LAB — BASIC METABOLIC PANEL
Anion gap: 7 (ref 5–15)
BUN: 12 mg/dL (ref 6–20)
CALCIUM: 8.5 mg/dL — AB (ref 8.9–10.3)
CO2: 27 mmol/L (ref 22–32)
Chloride: 104 mmol/L (ref 98–111)
Creatinine, Ser: 0.83 mg/dL (ref 0.44–1.00)
GFR calc Af Amer: >60 mL/min (ref >60)
GFR calc non Af Amer: >60 mL/min (ref >60)
GLUCOSE: 139 mg/dL — AB (ref 70–99)
Potassium: 3.2 mmol/L — ABNORMAL LOW (ref 3.5–5.1)
Sodium: 138 mmol/L (ref 135–145)

## 2018-09-15 LAB — C-REACTIVE PROTEIN: CRP: 2 mg/dL — AB (ref <1.0)

## 2018-09-15 MED ORDER — ACETAMINOPHEN 650 MG RE SUPP: 650.0000 mg | Freq: Four times a day (QID) | RECTAL | Status: DC | PRN

## 2018-09-15 MED ORDER — ENOXAPARIN SODIUM 40 MG/0.4ML ~~LOC~~ SOLN
40.0000 mg | SUBCUTANEOUS | Status: DC
  Filled 2018-09-15 (×2): qty 0.4

## 2018-09-15 MED ORDER — ACETAMINOPHEN 325 MG PO TABS: 650.0000 mg | ORAL_TABLET | Freq: Four times a day (QID) | ORAL | Status: DC | PRN

## 2018-09-15 MED ORDER — SODIUM CHLORIDE 0.9 % IV SOLN
INTRAVENOUS | Status: AC
  Administered 2018-09-15 (×2): via INTRAVENOUS

## 2018-09-15 MED ORDER — ATORVASTATIN CALCIUM 20 MG PO TABS
10.0000 mg | ORAL_TABLET | Freq: Every day | ORAL | Status: DC
  Administered 2018-09-15 – 2018-09-18 (×3): 10 mg via ORAL
  Filled 2018-09-15 (×3): qty 1

## 2018-09-15 MED ORDER — AMLODIPINE BESYLATE 5 MG PO TABS
5.0000 mg | ORAL_TABLET | Freq: Every day | ORAL | Status: DC
  Administered 2018-09-15 – 2018-09-18 (×4): 5 mg via ORAL
  Filled 2018-09-15 (×4): qty 1

## 2018-09-15 MED ORDER — MORPHINE SULFATE (PF) 2 MG/ML IV SOLN
2.0000 mg | INTRAVENOUS | Status: DC | PRN
  Administered 2018-09-17: 16:00:00 2 mg via INTRAVENOUS
  Filled 2018-09-15: qty 1

## 2018-09-15 MED ORDER — TRIAMTERENE-HCTZ 37.5-25 MG PO TABS
1.0000 | ORAL_TABLET | Freq: Every morning | ORAL | Status: DC
  Administered 2018-09-15 – 2018-09-18 (×4): 1 via ORAL
  Filled 2018-09-15 (×4): qty 1

## 2018-09-15 MED ORDER — DEXAMETHASONE SODIUM PHOSPHATE 10 MG/ML IJ SOLN
20.0000 mg | Freq: Once | INTRAMUSCULAR | Status: AC
  Administered 2018-09-15: 20 mg via INTRAVENOUS
  Filled 2018-09-15: qty 2

## 2018-09-15 MED ORDER — ONDANSETRON HCL 4 MG/2ML IJ SOLN: 4.0000 mg | Freq: Four times a day (QID) | INTRAMUSCULAR | Status: DC | PRN

## 2018-09-15 MED ORDER — ONDANSETRON HCL 4 MG PO TABS: 4.0000 mg | ORAL_TABLET | Freq: Four times a day (QID) | ORAL | Status: DC | PRN

## 2018-09-15 MED ORDER — MORPHINE SULFATE (PF) 2 MG/ML IV SOLN
2.0000 mg | Freq: Once | INTRAVENOUS | Status: AC
  Administered 2018-09-15: 2 mg via INTRAVENOUS
  Filled 2018-09-15: qty 1

## 2018-09-15 MED ORDER — OXYCODONE HCL 5 MG PO TABS
5.0000 mg | ORAL_TABLET | ORAL | Status: DC | PRN
  Administered 2018-09-15 – 2018-09-17 (×5): 5 mg via ORAL
  Filled 2018-09-15 (×5): qty 1

## 2018-09-15 NOTE — Consult Note (Signed)
[facility] Vascular Consult Note  MRN : [id number]  Joy Dunlap is a 53 y.o. ([birth year]) female who presents with chief complaint of  Chief Complaint  Patient presents with  . Abdominal Pain  .  History of Present Illness: I am asked to see the patient by Dr. Leslye Peer for evaluation a patient admitted with abdominal pain and CT findings of possible aortitis. I have independently reviewed her CT with contrast of the abdomen and pelvis. Periaortic inflammation at the level of the SMA and tracking up to the celiac and down to the renals is present.  This is a fairly subtle finding and not particularly impressive on my review of the scan. This is non-specific but could represent an aortitis/vasculitis. She was having severe N/V and abdominal pain but is much better today.  This has only been present for a day or two.  No clear inciting events or causative factors.  Having BM. The N/V is basically gone at this point.  No fever or chills.  No other systemic symptoms.   Current Facility-Administered Medications  Medication Dose Route Frequency Provider Last Rate Last Dose  . 0.9 %  sodium chloride infusion   Intravenous Continuous Lance Coon, MD 75 mL/hr at 09/15/18 1500    . acetaminophen (TYLENOL) tablet 650 mg  650 mg Oral Q6H PRN Lance Coon, MD       Or  . acetaminophen (TYLENOL) suppository 650 mg  650 mg Rectal Q6H PRN Lance Coon, MD      . amLODipine (NORVASC) tablet 5 mg  5 mg Oral Daily Lance Coon, MD   5 mg at 09/15/18 1014  . atorvastatin (LIPITOR) tablet 10 mg  10 mg Oral Daily Lance Coon, MD   10 mg at 09/15/18 1014  . enoxaparin (LOVENOX) injection 40 mg  40 mg Subcutaneous Q24H Lance Coon, MD      . morphine 2 MG/ML injection 2 mg  2 mg Intravenous Q4H PRN Lance Coon, MD      . ondansetron [facility]) tablet 4 mg  4 mg Oral Q6H PRN Lance Coon, MD       Or  . ondansetron [facility]) injection 4 mg  4 mg Intravenous Q6H PRN Lance Coon, MD      . oxyCODONE (Oxy IR/ROXICODONE) immediate release tablet 5 mg  5 mg Oral Q4H PRN Lance Coon, MD      . triamterene-hydrochlorothiazide (MAXZIDE-25) 37.5-25 MG per tablet 1 tablet  1 tablet Oral q morning - 10a Lance Coon, MD   1 tablet at 09/15/18 1014    Past Medical History:  Diagnosis Date  . Gallstones   . HLD (hyperlipidemia)   . Hypertension     Past Surgical History:  Procedure Laterality Date  . TUBAL LIGATION    . UTERINE FIBROID SURGERY      Social History Social History   Tobacco Use  . Smoking status: Never Smoker  . Smokeless tobacco: Never Used  Substance Use Topics  . Alcohol use: No    Frequency: Never  . Drug use: No    Family History Family History  Problem Relation Age of Onset  . Breast cancer Maternal Aunt 62  . Breast cancer Cousin        2 mat cousins  . Rheum arthritis Father   no bleeding or clotting disorders  Allergies  Allergen Reactions  . Flagyl [Metronidazole] Hives     REVIEW OF SYSTEMS (Negative unless checked)  Constitutional: [] Weight loss  []   Fever  [] Chills Cardiac: [] Chest pain   [] Chest pressure   [] Palpitations   [] Shortness of breath when laying flat   [] Shortness of breath at rest   [] Shortness of breath with exertion. Vascular:  [] Pain in legs with walking   [] Pain in legs at rest   [] Pain in legs when laying flat   [] Claudication   [] Pain in feet when walking  [] Pain in feet at rest  [] Pain in feet when laying flat   [] History of DVT   [] Phlebitis   [] Swelling in legs   [] Varicose veins   [] Non-healing ulcers Pulmonary:   [] Uses home oxygen   [] Productive cough   [] Hemoptysis   [] Wheeze  [] COPD   [] Asthma Neurologic:  [] Dizziness  [] Blackouts   [] Seizures   [] History of stroke   [] History of TIA  [] Aphasia   [] Temporary blindness   [] Dysphagia   [] Weakness or numbness in arms   [] Weakness or numbness in legs Musculoskeletal:  [] Arthritis   [] Joint swelling   [] Joint pain   [] Low back pain Hematologic:   [] Easy bruising  [] Easy bleeding   [] Hypercoagulable state   [] Anemic  [] Hepatitis Gastrointestinal:  [] Blood in stool   [] Vomiting blood  [x] Gastroesophageal reflux/heartburn   [] Difficulty swallowing. X Positive for abdominal pain and N/V Genitourinary:  [] Chronic kidney disease   [] Difficult urination  [] Frequent urination  [] Burning with urination   [] Blood in urine Skin:  [] Rashes   [] Ulcers   [] Wounds Psychological:  [] History of anxiety   []  History of major depression.  Physical Examination  Vitals:   09/15/18 0805 09/15/18 0856 09/15/18 0900 09/15/18 1711  BP:  136/75  133/72  Pulse:  (!) 59  66  Resp:  18  18  Temp: 98.3 F (36.8 C) 98.5 F (36.9 C)  98.1 F (36.7 C)  TempSrc: Oral Oral    SpO2:  100%  98%  Weight:   104.3 kg   Height:   5\' 6"  (1.676 m)    Body mass index is 37.12 kg/m. Gen:  WD/WN, NAD Head: [place]/AT, No temporalis wasting.  Ear/Nose/Throat: Hearing grossly intact, nares w/o erythema or drainage, oropharynx w/o Erythema/Exudate Eyes: Sclera non-icteric, conjunctiva clear Neck: Trachea midline.  No JVD.  Pulmonary:  Good air movement, respirations not labored, equal bilaterally.  Cardiac: RRR, no JVD Vascular:  Vessel Right Left  Radial Palpable Palpable                          PT Palpable Palpable  DP Palpable Palpable   Gastrointestinal: soft, non-tender/non-distended. No increased aortic impulse Musculoskeletal: M/S 5/5 throughout.  Extremities without ischemic changes.  No deformity or atrophy. No edema. Neurologic: Sensation grossly intact in extremities.  Symmetrical.  Speech is fluent. Motor exam as listed above. Psychiatric: Judgment intact, Mood & affect appropriate for pt's clinical situation. Dermatologic: No rashes or ulcers noted.  No cellulitis or open wounds.       CBC Lab Results  Component Value Date   WBC 7.5 09/15/2018   HGB 12.8 09/15/2018   HCT 40.5 09/15/2018   MCV 83.9 09/15/2018   PLT 380 09/15/2018     BMET    Component Value Date/Time   NA 138 09/15/2018 0906   NA 140 05/22/2013 2126   K 3.2 (L) 09/15/2018 0906   K 3.6 05/22/2013 2126   CL 104 09/15/2018 0906   CL 107 05/22/2013 2126   CO2 27 09/15/2018 0906   CO2 27 05/22/2013 2126  GLUCOSE 139 (H) 09/15/2018 0906   GLUCOSE 95 05/22/2013 2126   BUN 12 09/15/2018 0906   BUN 13 05/22/2013 2126   CREATININE 0.83 09/15/2018 0906   CREATININE 0.97 05/22/2013 2126   CALCIUM 8.5 (L) 09/15/2018 0906   CALCIUM 8.7 05/22/2013 2126   GFRNONAA >60 09/15/2018 0906   GFRNONAA >60 05/22/2013 2126   GFRAA >60 09/15/2018 0906   GFRAA >60 05/22/2013 2126   Estimated Creatinine Clearance: 99 mL/min (by C-G formula based on SCr of 0.83 mg/dL).  COAG No results found for: INR, PROTIME  Radiology Ct Renal Stone Study  Result Date: 09/14/2018 CLINICAL DATA:  Hematuria with abdominal pain EXAM: CT ABDOMEN AND PELVIS WITHOUT CONTRAST TECHNIQUE: Multidetector CT imaging of the abdomen and pelvis was performed following the standard protocol without IV contrast. COMPARISON:  CT abdomen pelvis 10/24/2005 FINDINGS: LOWER CHEST: There is no basilar pleural or apical pericardial effusion. HEPATOBILIARY: 2.8 cm cyst in the left hepatic lobe, increased in size from prior examination. No other focal liver lesions. Normal contours. Status post cholecystectomy. PANCREAS: The pancreatic parenchymal contours are normal and there is no ductal dilatation. There is no peripancreatic fluid collection. SPLEEN: Normal. ADRENALS/URINARY TRACT: --Adrenal glands: Normal. --Right kidney/ureter: No hydronephrosis, nephroureterolithiasis, perinephric stranding or solid renal mass. --Left kidney/ureter: No hydronephrosis, nephroureterolithiasis, perinephric stranding or solid renal mass. --Urinary bladder: Normal for degree of distention STOMACH/BOWEL: --Stomach/Duodenum: There is no hiatal hernia or other gastric abnormality. The duodenal course and caliber are normal.  --Small bowel: No dilatation or inflammation. --Colon: No focal abnormality. --Appendix: Normal. VASCULAR/LYMPHATIC: There is mild stranding surrounding the origins of the celiac axis and superior mesenteric artery. Otherwise normal appearance of the major abdominal vessels. No abdominal or pelvic lymphadenopathy. REPRODUCTIVE: Multiple partially calcified uterine fibroids, measuring up to 3 cm. Calcification is new compared to the prior CT. Normal adnexa. MUSCULOSKELETAL. No bony spinal canal stenosis or focal osseous abnormality. OTHER: None. IMPRESSION: 1. Mild stranding surrounding the origins of the celiac axis and superior mesenteric artery, which may indicate a mild vasculitis. Vascular patency could be confirmed with CT angiography. 2. No obstructive uropathy or nephrolithiasis. 3. Multiple calcified uterine fibroids. Electronically Signed   By: Ulyses Jarred M.D.   On: 09/14/2018 23:09   Ct Angio Abd/pel W And/or Wo Contrast  Result Date: 09/15/2018 CLINICAL DATA:  53 year old female with abnormal noncontrast CT Abdomen and Pelvis earlier. Hematuria and abdominal pain. Query celiac and SMA vasculitis. EXAM: CTA ABDOMEN AND PELVIS wITHOUT AND WITH CONTRAST TECHNIQUE: Multidetector CT imaging of the abdomen and pelvis was performed using the standard protocol during bolus administration of intravenous contrast. Multiplanar reconstructed images and MIPs were obtained and reviewed to evaluate the vascular anatomy. CONTRAST:  150mL ISOVUE-370 IOPAMIDOL (ISOVUE-370) INJECTION 76% COMPARISON:  CT Abdomen and Pelvis 09/14/2018. FINDINGS: VASCULAR Aorta: Patent without atherosclerosis, stenosis, wall thickening, aneurysm or dissection. Celiac: Patent without atherosclerosis or stenosis. Mild fat stranding about the celiac trunk. SMA: More moderate fat stranding about the proximal SMA which is mildly narrow it but remains patent (series 8, image 71 and series 9, image 120. No wall thickening. No atherosclerosis  identified. SMA branches appear patent. Renals: Patent. Mildly bordered by the fat stranding around the SMA. IMA: Patent and within normal limits. Bilateral iliac arteries and proximal femoral arteries appear normal. Veins: On delayed images the portal venous system is patent. No abnormality of the SMV is identified. Review of the MIP images confirms the above findings. NON-VASCULAR Lower chest: Stable mild cardiomegaly and lung base  atelectasis. Hepatobiliary: Simple fluid density central hepatic cyst. Surgically absent gallbladder. Pancreas: Pancreatic enhancement is within normal limits. Spleen: Negative. Adrenals/Urinary Tract: Normal adrenal glands. Bilateral renal enhancement is symmetric and within normal limits. Diminutive and unremarkable urinary bladder. Stomach/Bowel: Stable. Diverticulosis of the descending and sigmoid colon without active inflammation. Retained stool elsewhere. Negative retrocecal appendix. No dilated small bowel no free air, free fluid. Lymphatic: No lymphadenopathy. Reproductive: Stable, multiple calcified fibroids. Other: No pelvic free fluid. Musculoskeletal: No acute osseous abnormality identified. Mild chronic appearing trauma to the anterior left pubic symphysis with dystrophic calcification of the muscle. IMPRESSION: VASCULAR 1. Persistent inflammatory stranding along the ventral Aorta at the SMA origin, mildly present at the celiac and renal origins. No Aortic or SMA wall thickening, dissection, atherosclerosis, or occlusion. 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 Early retroperitoneal fibrosis is felt less likely. 2. SMV and portal venous system appear normal. NON-VASCULAR Stable from the earlier CT Abdomen and Pelvis.  No new abnormality. Electronically Signed   By: Genevie Ann M.D.   On: 09/15/2018 00:47      Assessment/Plan 1. 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  This is a fairly subtle finding and not particularly  impressive on my review of the scan.  Per patient report, CTA of C/A/P scheduled for tomorrow.  Will review these images as well.  Would agree with Dr. Jefm Bryant for now and hold on steroids.  This is likely not the cause of her symptoms. 2. HTN. Stable on outpatient medication and blood pressure control important in reducing the progression of atherosclerotic disease. On appropriate oral medications. 3. Hyperlipidemia. Stable on outpatient medications and lipid control important in reducing the progression of atherosclerotic disease. Continue statin therapy 4. Uterine fibroids.  S/p embolization of uterine artery and doing well.   Leotis Pain, MD  09/15/2018 5:55 PM    This note was created with Dragon medical transcription system.  Any error is purely unintentional

## 2018-09-15 NOTE — Consult Note (Signed)
Reason for Consult: Rule out vasculitis  Referring Physician: Hospitalist  Joy Dunlap   HPI: 53 year old African-American female.  Works as Research scientist (physical sciences) at [employer] History of hypertension and obesity. Prior history of uterine embolization for fibroid.  Prior history of cholecystectomy 2 days ago developed some anterior abdominal pain.  Some nausea.  Some heaving.  Greenish material.  Worse yesterday.  Had not really eaten.  Did have fever.  Came to the emergency room.  White count normal.  Urinalysis a few red cells and white cells.  Abdominal CT with contrast raise the question of stranding around the superior mesenteric artery.  No luminal changes.  No ischemic changes.  No history of fever or sweats.  No history of ray nodes.  No history of Crohn's disease.  No joint pain. Labs were pertinent for normal hemoglobin and creatinine.  Mild microscopic hematuria and pyuria.  Sed rate 40 She wants to eat.  Stomach pain is better  PMH: Hypertension.  Obesity.  SURGICAL HISTORY: Cholecystectomy.  Uterine fibroid embolization.  Family History: Father recently developed rheumatoid arthritis  Social History: No cigarettes or alcohol or illicit drugs.  Allergies:  Allergies  Allergen Reactions  . Flagyl [Metronidazole] Hives    Medications:  Scheduled: . amLODipine  5 mg Oral Daily  . atorvastatin  10 mg Oral Daily  . enoxaparin (LOVENOX) injection  40 mg Subcutaneous Q24H  . triamterene-hydrochlorothiazide  1 tablet Oral q morning - 10a        ROS: No shortness of breath.  No diarrhea.  No blood per rectum.  No skin rashes.  No photosensitivity.   PHYSICAL EXAM: Blood pressure 136/75, pulse (!) 59, temperature 98.5 F (36.9 C), temperature source Oral, resp. rate 18, height 5\' 6"  (1.676 m), weight 104.3 kg, SpO2 100 %. Pleasant female.  Sclera clear.  No cervical adenopathy.  Clear chest.  No murmur.  Mid abdominal tenderness.  Has bowel sounds. Good carotid  upstroke.  No carotid bruits or subclavian bruits.  Good femoral and distal pulses in the foot.  Feet are warm.  Radial and ulnar pulses intact. No skin rash.  No synovitis.  Symmetric reflexes and power  Assessment: Abnormal CTA with SMA fat stranding into the renal arteries but no occlusions, aneurysms,or beading stenoses.  Allergies would include would include infectious, IgG4 related disease,.  Not typical for polyarteritis nodosum or for Takayasu's disease.  However I doubt the CT findings are the cause of her pain.  May all be gastritis given her vomiting and fever.  Question pancreatitis though lipase was normal.  Mild pyuria and hematuria.  Recommendations: Urinalysis repeat, urine culture IgG4, serum proteins, FTA antibodies, hepatitis B and C, Vascular opinion May need GI opinion, endoscopy  Joy Dunlap 09/15/2018, 1:30 PM

## 2018-09-15 NOTE — ED Notes (Signed)
ED TO INPATIENT HANDOFF REPORT  Name/Age/Gender Joy Dunlap 53 y.o. female  Code Status Full  Home/SNF/Other Home  Chief Complaint abd pain  Level of Care/Admitting Diagnosis ED Disposition    ED Disposition Condition [facility] Area: [place] [100120]  Level of Care: Med-Surg [16]  Diagnosis: Abdominal pain [409811]  Admitting Physician: Lance Coon [[id number]]  Attending Physician: Lance Coon [[id number]]  PT Class (Do Not Modify): Observation [104]  PT Acc Code (Do Not Modify): Observation [10022]       Medical History Past Medical History:  Diagnosis Date  . Gallstones   . HLD (hyperlipidemia)   . Hypertension     Allergies Allergies  Allergen Reactions  . Flagyl [Metronidazole] Hives    IV Location/Drains/Wounds Patient Lines/Drains/Airways Status   Active Line/Drains/Airways    Name:   Placement date:   Placement time:   Site:   Days:   Peripheral IV 09/14/18 Right Antecubital   09/14/18    2226    Antecubital   1   Peripheral IV 09/15/18 Left Hand   09/15/18    0216    Hand   less than 1          Labs/Imaging Results for orders placed or performed during the hospital encounter of 09/14/18 (from the past 48 hour(s))  CBC with Differential     Status: Abnormal   Collection Time: 09/14/18  9:23 PM  Result Value Ref Range   WBC 10.6 (H) 4.0 - 10.5 K/uL   RBC 4.87 3.87 - 5.11 MIL/uL   Hemoglobin 12.8 12.0 - 15.0 g/dL   HCT 40.9 36.0 - 46.0 %   MCV 84.0 80.0 - 100.0 fL   MCH 26.3 26.0 - 34.0 pg   MCHC 31.3 30.0 - 36.0 g/dL   RDW 14.0 11.5 - 15.5 %   Platelets 375 150 - 400 K/uL   nRBC 0.0 0.0 - 0.2 %   Neutrophils Relative % 73 %   Neutro Abs 7.8 (H) 1.7 - 7.7 K/uL   Lymphocytes Relative 20 %   Lymphs Abs 2.1 0.7 - 4.0 K/uL   Monocytes Relative 5 %   Monocytes Absolute 0.5 0.1 - 1.0 K/uL   Eosinophils Relative 2 %   Eosinophils Absolute 0.2 0.0 - 0.5 K/uL   Basophils Relative 0 %   Basophils  Absolute 0.0 0.0 - 0.1 K/uL   Immature Granulocytes 0 %   Abs Immature Granulocytes 0.02 0.00 - 0.07 K/uL    Comment: Performed at [facility], [address] [place], [place] [address]  Comprehensive metabolic panel     Status: Abnormal   Collection Time: 09/14/18  9:23 PM  Result Value Ref Range   Sodium 140 135 - 145 mmol/L   Potassium 3.8 3.5 - 5.1 mmol/L   Chloride 103 98 - 111 mmol/L   CO2 27 22 - 32 mmol/L   Glucose, Bld 112 (H) 70 - 99 mg/dL   BUN 14 6 - 20 mg/dL   Creatinine, Ser 0.99 0.44 - 1.00 mg/dL   Calcium 8.7 (L) 8.9 - 10.3 mg/dL   Total Protein 7.7 6.5 - 8.1 g/dL   Albumin 3.9 3.5 - 5.0 g/dL   AST 16 15 - 41 U/L   ALT 18 0 - 44 U/L   Alkaline Phosphatase 62 38 - 126 U/L   Total Bilirubin 0.5 0.3 - 1.2 mg/dL   GFR calc non Af Amer >60 >60 mL/min   GFR  calc Af Amer >60 >60 mL/min   Anion gap 10 5 - 15    Comment: Performed at [facility], [address] [place], [place] [address]  Lipase, blood     Status: None   Collection Time: 09/14/18  9:23 PM  Result Value Ref Range   Lipase 34 11 - 51 U/L    Comment: Performed at [facility], [address] [place], [place] [address]  Urinalysis, Complete w Microscopic     Status: Abnormal   Collection Time: 09/14/18  9:23 PM  Result Value Ref Range   Color, Urine YELLOW (A) YELLOW   APPearance CLEAR (A) CLEAR   Specific Gravity, Urine 1.015 1.005 - 1.030   pH 6.0 5.0 - 8.0   Glucose, UA NEGATIVE NEGATIVE mg/dL   Hgb urine dipstick MODERATE (A) NEGATIVE   Bilirubin Urine NEGATIVE NEGATIVE   Ketones, ur NEGATIVE NEGATIVE mg/dL   Protein, ur NEGATIVE NEGATIVE mg/dL   Nitrite NEGATIVE NEGATIVE   Leukocytes,Ua TRACE (A) NEGATIVE   RBC / HPF 6-10 0 - 5 RBC/hpf   WBC, UA 6-10 0 - 5 WBC/hpf   Bacteria, UA RARE (A) NONE SEEN   Squamous Epithelial / LPF 6-10 0 - 5   Mucus PRESENT    Hyaline Casts, UA PRESENT     Comment: Performed at [facility], [address]  [place], [place] [address]  Pregnancy, urine POC     Status: None   Collection Time: 09/14/18  9:27 PM  Result Value Ref Range   Preg Test, Ur NEGATIVE NEGATIVE    Comment:        THE SENSITIVITY OF THIS METHODOLOGY IS >24 mIU/mL   Sedimentation rate     Status: Abnormal   Collection Time: 09/15/18  3:15 AM  Result Value Ref Range   Sed Rate 41 (H) 0 - 30 mm/hr    Comment: Performed at [facility], [address] [place], [place] [address]   Ct Renal Stone Study  Result Date: 09/14/2018 CLINICAL DATA:  Hematuria with abdominal pain EXAM: CT ABDOMEN AND PELVIS WITHOUT CONTRAST TECHNIQUE: Multidetector CT imaging of the abdomen and pelvis was performed following the standard protocol without IV contrast. COMPARISON:  CT abdomen pelvis 10/24/2005 FINDINGS: LOWER CHEST: There is no basilar pleural or apical pericardial effusion. HEPATOBILIARY: 2.8 cm cyst in the left hepatic lobe, increased in size from prior examination. No other focal liver lesions. Normal contours. Status post cholecystectomy. PANCREAS: The pancreatic parenchymal contours are normal and there is no ductal dilatation. There is no peripancreatic fluid collection. SPLEEN: Normal. ADRENALS/URINARY TRACT: --Adrenal glands: Normal. --Right kidney/ureter: No hydronephrosis, nephroureterolithiasis, perinephric stranding or solid renal mass. --Left kidney/ureter: No hydronephrosis, nephroureterolithiasis, perinephric stranding or solid renal mass. --Urinary bladder: Normal for degree of distention STOMACH/BOWEL: --Stomach/Duodenum: There is no hiatal hernia or other gastric abnormality. The duodenal course and caliber are normal. --Small bowel: No dilatation or inflammation. --Colon: No focal abnormality. --Appendix: Normal. VASCULAR/LYMPHATIC: There is mild stranding surrounding the origins of the celiac axis and superior mesenteric artery. Otherwise normal appearance of the major abdominal vessels. No abdominal or pelvic  lymphadenopathy. REPRODUCTIVE: Multiple partially calcified uterine fibroids, measuring up to 3 cm. Calcification is new compared to the prior CT. Normal adnexa. MUSCULOSKELETAL. No bony spinal canal stenosis or focal osseous abnormality. OTHER: None. IMPRESSION: 1. Mild stranding surrounding the origins of the celiac axis and superior mesenteric artery, which may indicate a mild vasculitis. Vascular patency could be confirmed with CT  angiography. 2. No obstructive uropathy or nephrolithiasis. 3. Multiple calcified uterine fibroids. Electronically Signed   By: Ulyses Jarred M.D.   On: 09/14/2018 23:09   Ct Angio Abd/pel W And/or Wo Contrast  Result Date: 09/15/2018 CLINICAL DATA:  53 year old female with abnormal noncontrast CT Abdomen and Pelvis earlier. Hematuria and abdominal pain. Query celiac and SMA vasculitis. EXAM: CTA ABDOMEN AND PELVIS wITHOUT AND WITH CONTRAST TECHNIQUE: Multidetector CT imaging of the abdomen and pelvis was performed using the standard protocol during bolus administration of intravenous contrast. Multiplanar reconstructed images and MIPs were obtained and reviewed to evaluate the vascular anatomy. CONTRAST:  119mL ISOVUE-370 IOPAMIDOL (ISOVUE-370) INJECTION 76% COMPARISON:  CT Abdomen and Pelvis 09/14/2018. FINDINGS: VASCULAR Aorta: Patent without atherosclerosis, stenosis, wall thickening, aneurysm or dissection. Celiac: Patent without atherosclerosis or stenosis. Mild fat stranding about the celiac trunk. SMA: More moderate fat stranding about the proximal SMA which is mildly narrow it but remains patent (series 8, image 71 and series 9, image 120. No wall thickening. No atherosclerosis identified. SMA branches appear patent. Renals: Patent. Mildly bordered by the fat stranding around the SMA. IMA: Patent and within normal limits. Bilateral iliac arteries and proximal femoral arteries appear normal. Veins: On delayed images the portal venous system is patent. No abnormality of the  SMV is identified. Review of the MIP images confirms the above findings. NON-VASCULAR Lower chest: Stable mild cardiomegaly and lung base atelectasis. Hepatobiliary: Simple fluid density central hepatic cyst. Surgically absent gallbladder. Pancreas: Pancreatic enhancement is within normal limits. Spleen: Negative. Adrenals/Urinary Tract: Normal adrenal glands. Bilateral renal enhancement is symmetric and within normal limits. Diminutive and unremarkable urinary bladder. Stomach/Bowel: Stable. Diverticulosis of the descending and sigmoid colon without active inflammation. Retained stool elsewhere. Negative retrocecal appendix. No dilated small bowel no free air, free fluid. Lymphatic: No lymphadenopathy. Reproductive: Stable, multiple calcified fibroids. Other: No pelvic free fluid. Musculoskeletal: No acute osseous abnormality identified. Mild chronic appearing trauma to the anterior left pubic symphysis with dystrophic calcification of the muscle. IMPRESSION: VASCULAR 1. Persistent inflammatory stranding along the ventral Aorta at the SMA origin, mildly present at the celiac and renal origins. No Aortic or SMA wall thickening, dissection, atherosclerosis, or occlusion. 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 Early retroperitoneal fibrosis is felt less likely. 2. SMV and portal venous system appear normal. NON-VASCULAR Stable from the earlier CT Abdomen and Pelvis.  No new abnormality. Electronically Signed   By: Genevie Ann M.D.   On: 09/15/2018 00:47    Pending Labs Unresulted Labs (From admission, onward)    Start     Ordered   09/15/18 0240  HIV antibody (Routine Testing)  Once,   STAT     09/15/18 0239   09/15/18 0231  Lupus anticoagulant  Once,   STAT     09/15/18 0233   09/15/18 0231  Complement, total  Once,   STAT     09/15/18 0233   09/15/18 0231  C3 complement  Once,   STAT     09/15/18 0233   09/15/18 0231  C4 complement  Once,   STAT     09/15/18 0233    09/15/18 0231  C-reactive protein  Once,   STAT     09/15/18 0233   09/15/18 0230  Mpo/pr-3 (anca) antibodies  Once,   STAT     09/15/18 0233   09/15/18 0230  Cardiolipin antibodies, IgM+IgG  Once,   STAT     09/15/18 0233   09/15/18 8413  ANA  Once,   STAT     09/15/18 0233   09/15/18 0229  Anti-DNA antibody, double-stranded  Once,   STAT     09/15/18 0233   09/15/18 0149  Blood culture (routine x 2)  BLOOD CULTURE X 2,   STAT     09/15/18 0148   Signed and Held  CBC  (enoxaparin (LOVENOX)    CrCl >/= 30 ml/min)  Once,   R    Comments:  Baseline for enoxaparin therapy IF NOT ALREADY DRAWN.  Notify MD if PLT < 100 K.    Signed and Held   Signed and Held  Creatinine, serum  (enoxaparin (LOVENOX)    CrCl >/= 30 ml/min)  Once,   R    Comments:  Baseline for enoxaparin therapy IF NOT ALREADY DRAWN.    Signed and Held   Signed and Held  Creatinine, serum  (enoxaparin (LOVENOX)    CrCl >/= 30 ml/min)  Weekly,   R    Comments:  while on enoxaparin therapy    Signed and Held   Signed and Held  Basic metabolic panel  Tomorrow morning,   R     Signed and Held   Signed and Held  CBC  Tomorrow morning,   R     Signed and Held          Vitals/Pain Today's Vitals   09/15/18 0330 09/15/18 0400 09/15/18 0430 09/15/18 0500  BP: (!) 145/76 132/73 125/81 130/65  Pulse: (!) 57 (!) 57 63 (!) 58  Resp: 18 16 12 18   Temp:      TempSrc:      SpO2: 97% 94% 93% 96%  Weight:      Height:      PainSc:  Asleep  Asleep    Isolation Precautions No active isolations  Medications Medications  fentaNYL (SUBLIMAZE) injection 50 mcg (50 mcg Intravenous Given 09/14/18 2231)  ondansetron (ZOFRAN) injection 4 mg (4 mg Intravenous Given 09/14/18 2230)  iopamidol (ISOVUE-370) 76 % injection 125 mL (125 mLs Intravenous Contrast Given 09/14/18 2359)  dexamethasone (DECADRON) injection 20 mg (20 mg Intravenous Given 09/15/18 0135)  morphine 2 MG/ML injection 2 mg (2 mg Intravenous Given 09/15/18 0134)     Mobility Walks by self

## 2018-09-15 NOTE — ED Notes (Signed)
Patient transported to CT 

## 2018-09-15 NOTE — Progress Notes (Signed)
Patient ID: Joy Dunlap, female   DOB: [birth year], 53 y.o.   MRN: [id number]  [employer] PROGRESS NOTE  Joy Dunlap [id number] DOB: 15-Jun-1966 DOA: 09/14/2018 PCP: Gayland Curry, MD  HPI/Subjective: Patient had severe pain in her abdomen when she came in.  Also had nausea vomiting.  Normal bowel movements.  Objective: Vitals:   09/15/18 0805 09/15/18 0856  BP:  136/75  Pulse:  (!) 59  Resp:  18  Temp: 98.3 F (36.8 C) 98.5 F (36.9 C)  SpO2:  100%   No intake or output data in the 24 hours ending 09/15/18 1404 Filed Weights   09/14/18 2118 09/15/18 0900  Weight: 104.3 kg 104.3 kg    ROS: Review of Systems  Constitutional: Negative for chills and fever.  Eyes: Negative for blurred vision.  Respiratory: Negative for cough and shortness of breath.   Cardiovascular: Negative for chest pain.  Gastrointestinal: Positive for abdominal pain, nausea and vomiting. Negative for constipation and diarrhea.  Genitourinary: Negative for dysuria.  Musculoskeletal: Negative for joint pain.  Neurological: Negative for dizziness and headaches.   Exam: Physical Exam  Constitutional: She is oriented to person, place, and time.  HENT:  Nose: No mucosal edema.  Mouth/Throat: No oropharyngeal exudate or posterior oropharyngeal edema.  Eyes: Pupils are equal, round, and reactive to light. Conjunctivae, EOM and lids are normal.  Neck: No JVD present. Carotid bruit is not present. No edema present. No thyroid mass and no thyromegaly present.  Cardiovascular: S1 normal and S2 normal. Exam reveals no gallop.  No murmur heard. Pulses:      Dorsalis pedis pulses are 2+ on the right side and 2+ on the left side.  Respiratory: No respiratory distress. She has no wheezes. She has no rhonchi. She has no rales.  GI: Soft. Bowel sounds are normal. There is abdominal tenderness in the periumbilical area.  Musculoskeletal:     Right ankle: She exhibits no swelling.     Left  ankle: She exhibits no swelling.  Lymphadenopathy:    She has no cervical adenopathy.  Neurological: She is alert and oriented to person, place, and time. No cranial nerve deficit.  Skin: Skin is warm. No rash noted. Nails show no clubbing.  Psychiatric: She has a normal mood and affect.      Data Reviewed: Basic Metabolic Panel: Recent Labs  Lab 09/14/18 2123 09/15/18 0906  NA 140 138  K 3.8 3.2*  CL 103 104  CO2 27 27  GLUCOSE 112* 139*  BUN 14 12  CREATININE 0.99 0.83  CALCIUM 8.7* 8.5*   Liver Function Tests: Recent Labs  Lab 09/14/18 2123  AST 16  ALT 18  ALKPHOS 62  BILITOT 0.5  PROT 7.7  ALBUMIN 3.9   Recent Labs  Lab 09/14/18 2123  LIPASE 34   CBC: Recent Labs  Lab 09/14/18 2123 09/15/18 0906  WBC 10.6* 7.5  NEUTROABS 7.8*  --   HGB 12.8 12.8  HCT 40.9 40.5  MCV 84.0 83.9  PLT 375 380     Recent Results (from the past 240 hour(s))  Blood culture (routine x 2)     Status: None (Preliminary result)   Collection Time: 09/15/18  2:12 AM  Result Value Ref Range Status   Specimen Description BLOOD RIGHT HAND  Final   Special Requests   Final    BOTTLES DRAWN AEROBIC AND ANAEROBIC Blood Culture adequate volume   Culture   Final    NO GROWTH < 12  HOURS Performed at [facility], [address] [place], [place] [address]    Report Status PENDING  Incomplete  Blood culture (routine x 2)     Status: None (Preliminary result)   Collection Time: 09/15/18  2:13 AM  Result Value Ref Range Status   Specimen Description BLOOD LEFT HAND  Final   Special Requests   Final    BOTTLES DRAWN AEROBIC AND ANAEROBIC Blood Culture adequate volume   Culture   Final    NO GROWTH < 12 HOURS Performed at [facility], [address] [place], [place] [address]    Report Status PENDING  Incomplete     Studies: Ct Renal Stone Study  Result Date: 09/14/2018 CLINICAL DATA:  Hematuria with abdominal pain EXAM: CT ABDOMEN AND PELVIS WITHOUT  CONTRAST TECHNIQUE: Multidetector CT imaging of the abdomen and pelvis was performed following the standard protocol without IV contrast. COMPARISON:  CT abdomen pelvis 10/24/2005 FINDINGS: LOWER CHEST: There is no basilar pleural or apical pericardial effusion. HEPATOBILIARY: 2.8 cm cyst in the left hepatic lobe, increased in size from prior examination. No other focal liver lesions. Normal contours. Status post cholecystectomy. PANCREAS: The pancreatic parenchymal contours are normal and there is no ductal dilatation. There is no peripancreatic fluid collection. SPLEEN: Normal. ADRENALS/URINARY TRACT: --Adrenal glands: Normal. --Right kidney/ureter: No hydronephrosis, nephroureterolithiasis, perinephric stranding or solid renal mass. --Left kidney/ureter: No hydronephrosis, nephroureterolithiasis, perinephric stranding or solid renal mass. --Urinary bladder: Normal for degree of distention STOMACH/BOWEL: --Stomach/Duodenum: There is no hiatal hernia or other gastric abnormality. The duodenal course and caliber are normal. --Small bowel: No dilatation or inflammation. --Colon: No focal abnormality. --Appendix: Normal. VASCULAR/LYMPHATIC: There is mild stranding surrounding the origins of the celiac axis and superior mesenteric artery. Otherwise normal appearance of the major abdominal vessels. No abdominal or pelvic lymphadenopathy. REPRODUCTIVE: Multiple partially calcified uterine fibroids, measuring up to 3 cm. Calcification is new compared to the prior CT. Normal adnexa. MUSCULOSKELETAL. No bony spinal canal stenosis or focal osseous abnormality. OTHER: None. IMPRESSION: 1. Mild stranding surrounding the origins of the celiac axis and superior mesenteric artery, which may indicate a mild vasculitis. Vascular patency could be confirmed with CT angiography. 2. No obstructive uropathy or nephrolithiasis. 3. Multiple calcified uterine fibroids. Electronically Signed   By: Ulyses Jarred M.D.   On: 09/14/2018 23:09    Ct Angio Abd/pel W And/or Wo Contrast  Result Date: 09/15/2018 CLINICAL DATA:  53 year old female with abnormal noncontrast CT Abdomen and Pelvis earlier. Hematuria and abdominal pain. Query celiac and SMA vasculitis. EXAM: CTA ABDOMEN AND PELVIS wITHOUT AND WITH CONTRAST TECHNIQUE: Multidetector CT imaging of the abdomen and pelvis was performed using the standard protocol during bolus administration of intravenous contrast. Multiplanar reconstructed images and MIPs were obtained and reviewed to evaluate the vascular anatomy. CONTRAST:  171mL ISOVUE-370 IOPAMIDOL (ISOVUE-370) INJECTION 76% COMPARISON:  CT Abdomen and Pelvis 09/14/2018. FINDINGS: VASCULAR Aorta: Patent without atherosclerosis, stenosis, wall thickening, aneurysm or dissection. Celiac: Patent without atherosclerosis or stenosis. Mild fat stranding about the celiac trunk. SMA: More moderate fat stranding about the proximal SMA which is mildly narrow it but remains patent (series 8, image 71 and series 9, image 120. No wall thickening. No atherosclerosis identified. SMA branches appear patent. Renals: Patent. Mildly bordered by the fat stranding around the SMA. IMA: Patent and within normal limits. Bilateral iliac arteries and proximal femoral arteries appear normal. Veins: On delayed images the portal venous system is patent. No abnormality of the SMV is identified.  Review of the MIP images confirms the above findings. NON-VASCULAR Lower chest: Stable mild cardiomegaly and lung base atelectasis. Hepatobiliary: Simple fluid density central hepatic cyst. Surgically absent gallbladder. Pancreas: Pancreatic enhancement is within normal limits. Spleen: Negative. Adrenals/Urinary Tract: Normal adrenal glands. Bilateral renal enhancement is symmetric and within normal limits. Diminutive and unremarkable urinary bladder. Stomach/Bowel: Stable. Diverticulosis of the descending and sigmoid colon without active inflammation. Retained stool elsewhere.  Negative retrocecal appendix. No dilated small bowel no free air, free fluid. Lymphatic: No lymphadenopathy. Reproductive: Stable, multiple calcified fibroids. Other: No pelvic free fluid. Musculoskeletal: No acute osseous abnormality identified. Mild chronic appearing trauma to the anterior left pubic symphysis with dystrophic calcification of the muscle. IMPRESSION: VASCULAR 1. Persistent inflammatory stranding along the ventral Aorta at the SMA origin, mildly present at the celiac and renal origins. No Aortic or SMA wall thickening, dissection, atherosclerosis, or occlusion. 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 Early retroperitoneal fibrosis is felt less likely. 2. SMV and portal venous system appear normal. NON-VASCULAR Stable from the earlier CT Abdomen and Pelvis.  No new abnormality. Electronically Signed   By: Genevie Ann M.D.   On: 09/15/2018 00:47    Scheduled Meds: . amLODipine  5 mg Oral Daily  . atorvastatin  10 mg Oral Daily  . enoxaparin (LOVENOX) injection  40 mg Subcutaneous Q24H  . triamterene-hydrochlorothiazide  1 tablet Oral q morning - 10a   Continuous Infusions: . sodium chloride 75 mL/hr at 09/15/18 0919    Assessment/Plan:  1. Abdominal pain and nausea vomiting.  Patient is better at this point only having some soreness in her abdomen.  She wants to advance her diet to solid food.  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  Case discussed with Dr. Jefm Bryant rheumatology and some extra blood work sent off including IgG4 RPR and hepatitis profiles.  Continue PRN medications for nausea and pain.  Gentle IV fluid hydration.  The patient did receive steroids in the emergency room.  Dr. Jefm Bryant wanted to hold off on further steroids at this point.  Dr. Jefm Bryant also recommended a vascular consult to review the CT scan.  This could also be a viral gastroenteritis.  Lipase normal range. 2. Hypertension.  On amlodipine and Maxide 3. Hyperlipidemia  unspecified on atorvastatin 4. Obesity with a BMI of 37.12.  Weight loss needed  Code Status:     Code Status Orders  (From admission, onward)         Start     Ordered   09/15/18 0855  Full code  Continuous     09/15/18 0855        Code Status History    This patient has a current code status but no historical code status.      Disposition Plan: Reevaluate tomorrow  Consultants:  Rheumatology  Vascular surgery  Time spent: 35 minutes  [place]

## 2018-09-15 NOTE — ED Notes (Signed)
Floor not able to take pt at this time but would take pt at 0730.

## 2018-09-15 NOTE — ED Notes (Signed)
[facility] RN informed of afebrile temp 98.3 before transport to floor.

## 2018-09-15 NOTE — H&P (Signed)
[name] [place] NAME: Joy Dunlap    MR#:  [id number]  DATE OF BIRTH:  1966-03-19  DATE OF ADMISSION:  09/14/2018  PRIMARY CARE PHYSICIAN: Gayland Curry, MD   REQUESTING/REFERRING PHYSICIAN: Owens Shark, MD  CHIEF COMPLAINT:   Chief Complaint  Patient presents with  . Abdominal Pain    HISTORY OF PRESENT ILLNESS:  Joy Dunlap  is a 53 y.o. female who presents with chief complaint as above.  Patient presents to the ED with 36 hours of abdominal pain.  She states this pain began yesterday afternoon, and has progressed since that time and intensity and duration.  Her pain is in her central abdomen, nonradiating, aching in character.  She states that today it progressed significantly until it was 10 out of 10.  She states the pain was worse than childbirth contractions.  Here in the ED her work-up was largely within normal limits except for CT abdomen imaging which shows stranding around aorta, celiac axis, renal arteries suggestive of vasculitis.  Hospitalist were called for admission and further evaluation  PAST MEDICAL HISTORY:   Past Medical History:  Diagnosis Date  . Gallstones   . HLD (hyperlipidemia)   . Hypertension      PAST SURGICAL HISTORY:   Past Surgical History:  Procedure Laterality Date  . TUBAL LIGATION    . UTERINE FIBROID SURGERY       SOCIAL HISTORY:   Social History   Tobacco Use  . Smoking status: Never Smoker  . Smokeless tobacco: Never Used  Substance Use Topics  . Alcohol use: No    Frequency: Never     FAMILY HISTORY:   Family History  Problem Relation Age of Onset  . Breast cancer Maternal Aunt 62  . Breast cancer Cousin        2 mat cousins  . Rheum arthritis Father      DRUG ALLERGIES:   Allergies  Allergen Reactions  . Flagyl [Metronidazole] Hives    MEDICATIONS AT HOME:   Prior to Admission medications   Medication Sig Start Date End Date Taking?  Authorizing Provider  amLODipine (NORVASC) 5 MG tablet Take 5 mg by mouth daily. 06/16/18 06/16/19 Yes [provider]  atorvastatin (LIPITOR) 10 MG tablet Take 10 mg by mouth daily. 07/08/18 07/08/19 Yes [provider]  fluticasone (FLONASE) 50 MCG/ACT nasal spray Place 2 sprays into the nose daily. 07/07/18 07/07/19 Yes [provider]  ipratropium (ATROVENT) 0.03 % nasal spray Place 2 sprays into the nose 3 (three) times daily as needed. 06/04/18 06/04/19 Yes [provider]  triamterene-hydrochlorothiazide (DYAZIDE) 37.5-25 MG capsule Take 1 capsule by mouth every morning. 06/16/18 06/16/19 Yes [provider]    REVIEW OF SYSTEMS:  Review of Systems  Constitutional: Negative for chills, fever, malaise/fatigue and weight loss.  HENT: Negative for ear pain, hearing loss and tinnitus.   Eyes: Negative for blurred vision, double vision, pain and redness.  Respiratory: Negative for cough, hemoptysis and shortness of breath.   Cardiovascular: Negative for chest pain, palpitations, orthopnea and leg swelling.  Gastrointestinal: Positive for abdominal pain. Negative for constipation, diarrhea, nausea and vomiting.  Genitourinary: Negative for dysuria, frequency and hematuria.  Musculoskeletal: Negative for back pain, joint pain and neck pain.  Skin:       No acne, rash, or lesions  Neurological: Negative for dizziness, tremors, focal weakness and weakness.  Endo/Heme/Allergies: Negative for polydipsia. Does not bruise/bleed easily.  Psychiatric/Behavioral: Negative for  depression. The patient is not nervous/anxious and does not have insomnia.      VITAL SIGNS:   Vitals:   09/15/18 0045 09/15/18 0100 09/15/18 0115 09/15/18 0130  BP:  (!) 149/79  (!) 150/97  Pulse: 67 63 61 67  Resp:    18  Temp:      TempSrc:      SpO2: 99% 98% 95% 100%  Weight:      Height:       Wt Readings from Last 3 Encounters:  09/14/18 104.3 kg  09/07/17 104.3 kg     PHYSICAL EXAMINATION:  Physical Exam  Vitals reviewed. Constitutional: She is oriented to person, place, and time. She appears well-developed and well-nourished. No distress.  HENT:  Head: Normocephalic and atraumatic.  Mouth/Throat: Oropharynx is clear and moist.  Eyes: Pupils are equal, round, and reactive to light. Conjunctivae and EOM are normal. No scleral icterus.  Neck: Normal range of motion. Neck supple. No JVD present. No thyromegaly present.  Cardiovascular: Normal rate, regular rhythm and intact distal pulses. Exam reveals no gallop and no friction rub.  No murmur heard. Respiratory: Effort normal and breath sounds normal. No respiratory distress. She has no wheezes. She has no rales.  GI: Soft. Bowel sounds are normal. She exhibits no distension. There is abdominal tenderness.  Musculoskeletal: Normal range of motion.        General: No edema.     Comments: No arthritis, no gout  Lymphadenopathy:    She has no cervical adenopathy.  Neurological: She is alert and oriented to person, place, and time. No cranial nerve deficit.  No dysarthria, no aphasia  Skin: Skin is warm and dry. No rash noted. No erythema.  Psychiatric: She has a normal mood and affect. Her behavior is normal. Judgment and thought content normal.    LABORATORY PANEL:   CBC Recent Labs  Lab 09/14/18 2123  WBC 10.6*  HGB 12.8  HCT 40.9  PLT 375   ------------------------------------------------------------------------------------------------------------------  Chemistries  Recent Labs  Lab 09/14/18 2123  NA 140  K 3.8  CL 103  CO2 27  GLUCOSE 112*  BUN 14  CREATININE 0.99  CALCIUM 8.7*  AST 16  ALT 18  ALKPHOS 62  BILITOT 0.5   ------------------------------------------------------------------------------------------------------------------  Cardiac Enzymes No results for input(s): TROPONINI in the last 168  hours. ------------------------------------------------------------------------------------------------------------------  RADIOLOGY:  Ct Renal Stone Study  Result Date: 09/14/2018 CLINICAL DATA:  Hematuria with abdominal pain EXAM: CT ABDOMEN AND PELVIS WITHOUT CONTRAST TECHNIQUE: Multidetector CT imaging of the abdomen and pelvis was performed following the standard protocol without IV contrast. COMPARISON:  CT abdomen pelvis 10/24/2005 FINDINGS: LOWER CHEST: There is no basilar pleural or apical pericardial effusion. HEPATOBILIARY: 2.8 cm cyst in the left hepatic lobe, increased in size from prior examination. No other focal liver lesions. Normal contours. Status post cholecystectomy. PANCREAS: The pancreatic parenchymal contours are normal and there is no ductal dilatation. There is no peripancreatic fluid collection. SPLEEN: Normal. ADRENALS/URINARY TRACT: --Adrenal glands: Normal. --Right kidney/ureter: No hydronephrosis, nephroureterolithiasis, perinephric stranding or solid renal mass. --Left kidney/ureter: No hydronephrosis, nephroureterolithiasis, perinephric stranding or solid renal mass. --Urinary bladder: Normal for degree of distention STOMACH/BOWEL: --Stomach/Duodenum: There is no hiatal hernia or other gastric abnormality. The duodenal course and caliber are normal. --Small bowel: No dilatation or inflammation. --Colon: No focal abnormality. --Appendix: Normal. VASCULAR/LYMPHATIC: There is mild stranding surrounding the origins of the celiac axis and superior mesenteric artery. Otherwise normal appearance of the major abdominal vessels. No  abdominal or pelvic lymphadenopathy. REPRODUCTIVE: Multiple partially calcified uterine fibroids, measuring up to 3 cm. Calcification is new compared to the prior CT. Normal adnexa. MUSCULOSKELETAL. No bony spinal canal stenosis or focal osseous abnormality. OTHER: None. IMPRESSION: 1. Mild stranding surrounding the origins of the celiac axis and superior  mesenteric artery, which may indicate a mild vasculitis. Vascular patency could be confirmed with CT angiography. 2. No obstructive uropathy or nephrolithiasis. 3. Multiple calcified uterine fibroids. Electronically Signed   By: Ulyses Jarred M.D.   On: 09/14/2018 23:09   Ct Angio Abd/pel W And/or Wo Contrast  Result Date: 09/15/2018 CLINICAL DATA:  53 year old female with abnormal noncontrast CT Abdomen and Pelvis earlier. Hematuria and abdominal pain. Query celiac and SMA vasculitis. EXAM: CTA ABDOMEN AND PELVIS wITHOUT AND WITH CONTRAST TECHNIQUE: Multidetector CT imaging of the abdomen and pelvis was performed using the standard protocol during bolus administration of intravenous contrast. Multiplanar reconstructed images and MIPs were obtained and reviewed to evaluate the vascular anatomy. CONTRAST:  129mL ISOVUE-370 IOPAMIDOL (ISOVUE-370) INJECTION 76% COMPARISON:  CT Abdomen and Pelvis 09/14/2018. FINDINGS: VASCULAR Aorta: Patent without atherosclerosis, stenosis, wall thickening, aneurysm or dissection. Celiac: Patent without atherosclerosis or stenosis. Mild fat stranding about the celiac trunk. SMA: More moderate fat stranding about the proximal SMA which is mildly narrow it but remains patent (series 8, image 71 and series 9, image 120. No wall thickening. No atherosclerosis identified. SMA branches appear patent. Renals: Patent. Mildly bordered by the fat stranding around the SMA. IMA: Patent and within normal limits. Bilateral iliac arteries and proximal femoral arteries appear normal. Veins: On delayed images the portal venous system is patent. No abnormality of the SMV is identified. Review of the MIP images confirms the above findings. NON-VASCULAR Lower chest: Stable mild cardiomegaly and lung base atelectasis. Hepatobiliary: Simple fluid density central hepatic cyst. Surgically absent gallbladder. Pancreas: Pancreatic enhancement is within normal limits. Spleen: Negative. Adrenals/Urinary  Tract: Normal adrenal glands. Bilateral renal enhancement is symmetric and within normal limits. Diminutive and unremarkable urinary bladder. Stomach/Bowel: Stable. Diverticulosis of the descending and sigmoid colon without active inflammation. Retained stool elsewhere. Negative retrocecal appendix. No dilated small bowel no free air, free fluid. Lymphatic: No lymphadenopathy. Reproductive: Stable, multiple calcified fibroids. Other: No pelvic free fluid. Musculoskeletal: No acute osseous abnormality identified. Mild chronic appearing trauma to the anterior left pubic symphysis with dystrophic calcification of the muscle. IMPRESSION: VASCULAR 1. Persistent inflammatory stranding along the ventral Aorta at the SMA origin, mildly present at the celiac and renal origins. No Aortic or SMA wall thickening, dissection, atherosclerosis, or occlusion. 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 Early retroperitoneal fibrosis is felt less likely. 2. SMV and portal venous system appear normal. NON-VASCULAR Stable from the earlier CT Abdomen and Pelvis.  No new abnormality. Electronically Signed   By: Genevie Ann M.D.   On: 09/15/2018 00:47    EKG:   Orders placed or performed during the hospital encounter of 09/07/17  . ED EKG within 10 minutes  . EKG 12-Lead  . EKG 12-Lead  . ED EKG within 10 minutes  . EKG    IMPRESSION AND PLAN:  Principal Problem:   Abdominal pain -patient's pain is improved with PRN analgesics in the ED.  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  She has a family history of rheumatoid arthritis in her father, but no other family history of autoimmune pathology that she is aware of.  We have ordered blood tests initially to help screen  for some common causes of vasculitis, and we will get a rheumatology consult.  Patient may need further MR angiography imaging, will defer to rheumatology's recommendation Active Problems:   HTN (hypertension) -continue home  meds   HLD (hyperlipidemia) -home dose antilipid  Chart review performed and case discussed with ED provider. Labs, imaging and/or ECG reviewed by provider and discussed with patient/family. Management plans discussed with the patient and/or family.  DVT PROPHYLAXIS: SubQ lovenox   GI PROPHYLAXIS:  None  ADMISSION STATUS: Observation  CODE STATUS: Full  TOTAL TIME TAKING CARE OF THIS PATIENT: 40 minutes.   Ethlyn Daniels 09/15/2018, 2:38 AM  [facility]  Office  [phone]  CC: Primary care physician; Gayland Curry, MD  Note:  This document was prepared using Dragon voice recognition software and may include unintentional dictation errors.

## 2018-09-16 ENCOUNTER — Observation Stay: Payer: BC Managed Care – PPO

## 2018-09-16 DIAGNOSIS — R1013 Epigastric pain: Secondary | ICD-10-CM

## 2018-09-16 DIAGNOSIS — R1084 Generalized abdominal pain: Secondary | ICD-10-CM | POA: Diagnosis not present

## 2018-09-16 LAB — LUPUS ANTICOAGULANT
DRVVT: 29.1 s (ref 0.0–47.0)
PTT Lupus Anticoagulant: 35.2 s (ref 0.0–51.9)
Thrombin Time: 17.2 s (ref 0.0–23.0)
dPT Confirm Ratio: 0.79 Ratio (ref 0.00–1.40)
dPT: 34.8 s (ref 0.0–55.0)

## 2018-09-16 LAB — ANTI-DNA ANTIBODY, DOUBLE-STRANDED: ds DNA Ab: 2 IU/mL (ref 0–9)

## 2018-09-16 LAB — C4 COMPLEMENT: Complement C4, Body Fluid: 35 mg/dL (ref 14–44)

## 2018-09-16 LAB — RPR: RPR: NONREACTIVE

## 2018-09-16 LAB — MPO/PR-3 (ANCA) ANTIBODIES: ANCA Proteinase 3: <3.5 U/mL (ref 0.0–3.5)

## 2018-09-16 LAB — HEPATITIS B SURFACE ANTIBODY, QUANTITATIVE: Hep B S AB Quant (Post): 37.3 m[IU]/mL (ref >9.9)

## 2018-09-16 LAB — HEPATITIS B SURFACE ANTIGEN: Hepatitis B Surface Ag: NEGATIVE

## 2018-09-16 LAB — IGG 4: IgG, Subclass 4: 66 mg/dL (ref 2–96)

## 2018-09-16 LAB — C3 COMPLEMENT: C3 COMPLEMENT: 130 mg/dL (ref 82–167)

## 2018-09-16 LAB — ANA: Anti Nuclear Antibody(ANA): NEGATIVE

## 2018-09-16 LAB — HEPATITIS C ANTIBODY

## 2018-09-16 LAB — HIV ANTIBODY (ROUTINE TESTING W REFLEX): HIV Screen 4th Generation wRfx: NONREACTIVE

## 2018-09-16 LAB — COMPLEMENT, TOTAL: Compl, Total (CH50): >60 U/mL (ref >41)

## 2018-09-16 LAB — HEPATITIS B CORE ANTIBODY, TOTAL: Hep B Core Total Ab: NEGATIVE

## 2018-09-16 MED ORDER — PANTOPRAZOLE SODIUM 40 MG IV SOLR
40.0000 mg | Freq: Two times a day (BID) | INTRAVENOUS | Status: DC
  Administered 2018-09-16 – 2018-09-18 (×5): 40 mg via INTRAVENOUS
  Filled 2018-09-16 (×5): qty 40

## 2018-09-16 MED ORDER — POTASSIUM CHLORIDE CRYS ER 20 MEQ PO TBCR
40.0000 meq | EXTENDED_RELEASE_TABLET | Freq: Once | ORAL | Status: AC
  Administered 2018-09-16: 12:00:00 40 meq via ORAL
  Filled 2018-09-16: qty 2

## 2018-09-16 MED ORDER — IOHEXOL 350 MG/ML SOLN
75.0000 mL | Freq: Once | INTRAVENOUS | Status: AC | PRN
  Administered 2018-09-16: 75 mL via INTRAVENOUS

## 2018-09-16 MED ORDER — SODIUM CHLORIDE 0.9 % IV SOLN
INTRAVENOUS | Status: DC
  Administered 2018-09-16 (×3): via INTRAVENOUS

## 2018-09-16 NOTE — Progress Notes (Signed)
Patient ID: Sharlett Iles, female   DOB: 09-Dec-1965, 53 y.o.   MRN: [id number]  [facility] PROGRESS NOTE  LAKIAH DHINGRA JOI:[id number] DOB: 1966-02-23 DOA: 09/14/2018 PCP: Gayland Curry, MD  HPI/Subjective: Patient feeling a little bit better but still having epigastric pain.  She was able to eat a little breakfast this morning and a solid after the CAT scan today  Objective: Vitals:   09/16/18 0803 09/16/18 0806  BP: (!) 150/78 (!) 153/80  Pulse: 62   Resp: 18   Temp: 98.1 F (36.7 C)   SpO2: 98%     Intake/Output Summary (Last 24 hours) at 09/16/2018 1355 Last data filed at 09/16/2018 1300 Gross per 24 hour  Intake 1529.31 ml  Output -  Net 1529.31 ml   Filed Weights   09/14/18 2118 09/15/18 0900  Weight: 104.3 kg 104.3 kg    ROS: Review of Systems  Constitutional: Negative for chills and fever.  Eyes: Negative for blurred vision.  Respiratory: Negative for cough and shortness of breath.   Cardiovascular: Negative for chest pain.  Gastrointestinal: Positive for abdominal pain. Negative for constipation, diarrhea, nausea and vomiting.  Genitourinary: Negative for dysuria.  Musculoskeletal: Negative for joint pain.  Neurological: Negative for dizziness and headaches.   Exam: Physical Exam  Constitutional: She is oriented to person, place, and time.  HENT:  Nose: No mucosal edema.  Mouth/Throat: No oropharyngeal exudate or posterior oropharyngeal edema.  Eyes: Pupils are equal, round, and reactive to light. Conjunctivae, EOM and lids are normal.  Neck: No JVD present. Carotid bruit is not present. No edema present. No thyroid mass and no thyromegaly present.  Cardiovascular: S1 normal and S2 normal. Exam reveals no gallop.  No murmur heard. Pulses:      Dorsalis pedis pulses are 2+ on the right side and 2+ on the left side.  Respiratory: No respiratory distress. She has no wheezes. She has no rhonchi. She has no rales.  GI: Soft. Bowel sounds  are normal. There is abdominal tenderness in the periumbilical area.  Musculoskeletal:     Right ankle: She exhibits no swelling.     Left ankle: She exhibits no swelling.  Lymphadenopathy:    She has no cervical adenopathy.  Neurological: She is alert and oriented to person, place, and time. No cranial nerve deficit.  Skin: Skin is warm. No rash noted. Nails show no clubbing.  Psychiatric: She has a normal mood and affect.      Data Reviewed: Basic Metabolic Panel: Recent Labs  Lab 09/14/18 2123 09/15/18 0906  NA 140 138  K 3.8 3.2*  CL 103 104  CO2 27 27  GLUCOSE 112* 139*  BUN 14 12  CREATININE 0.99 0.83  CALCIUM 8.7* 8.5*   Liver Function Tests: Recent Labs  Lab 09/14/18 2123  AST 16  ALT 18  ALKPHOS 62  BILITOT 0.5  PROT 7.7  ALBUMIN 3.9   Recent Labs  Lab 09/14/18 2123  LIPASE 34   CBC: Recent Labs  Lab 09/14/18 2123 09/15/18 0906  WBC 10.6* 7.5  NEUTROABS 7.8*  --   HGB 12.8 12.8  HCT 40.9 40.5  MCV 84.0 83.9  PLT 375 380     Recent Results (from the past 240 hour(s))  Blood culture (routine x 2)     Status: None (Preliminary result)   Collection Time: 09/15/18  2:12 AM  Result Value Ref Range Status   Specimen Description BLOOD RIGHT HAND  Final   Special Requests  Final    BOTTLES DRAWN AEROBIC AND ANAEROBIC Blood Culture adequate volume   Culture   Final    NO GROWTH 1 DAY Performed at [facility], [address] [place], [place] [address]    Report Status PENDING  Incomplete  Blood culture (routine x 2)     Status: None (Preliminary result)   Collection Time: 09/15/18  2:13 AM  Result Value Ref Range Status   Specimen Description BLOOD LEFT HAND  Final   Special Requests   Final    BOTTLES DRAWN AEROBIC AND ANAEROBIC Blood Culture adequate volume   Culture   Final    NO GROWTH 1 DAY Performed at [facility], [address] [place], [place] [address]    Report Status PENDING  Incomplete      Studies: Ct Angio Chest Aorta W/cm &/or Wo/cm  Result Date: 09/16/2018 CLINICAL DATA:  Clinical concern for aortitis. EXAM: CT ANGIOGRAPHY CHEST WITH CONTRAST TECHNIQUE: Multidetector CT imaging of the chest was performed using the standard protocol during bolus administration of intravenous contrast. Multiplanar CT image reconstructions and MIPs were obtained to evaluate the vascular anatomy. CONTRAST:  16mL OMNIPAQUE IOHEXOL 350 MG/ML SOLN COMPARISON:  Chest radiograph 09/07/2017 FINDINGS: Cardiovascular: Satisfactory opacification of the pulmonary arteries to the segmental level. No evidence of pulmonary embolism. Normal heart size. No pericardial effusion. Normal appearance of the thoracic aorta. Small amount of air within the main pulmonary trunk and left ventricle, likely iatrogenic. Mediastinum/Nodes: No enlarged mediastinal, hilar, or axillary lymph nodes. Thyroid gland, trachea, and esophagus demonstrate no significant findings. Lungs/Pleura: Lungs are clear. No pleural effusion or pneumothorax. Lingular atelectasis versus scarring. Upper Abdomen: Benign-appearing liver cyst. The previously noted inflammatory fat stranding surrounding the origin SMA is only peripherally visualized. Musculoskeletal: No chest wall abnormality. No acute or significant osseous findings. Review of the MIP images confirms the above findings. IMPRESSION: 1. Normal appearance of the thoracic aorta. No evidence of pulmonary embolus. 2. Lingular atelectasis versus scarring. Electronically Signed   By: Fidela Salisbury M.D.   On: 09/16/2018 11:21   Ct Renal Stone Study  Result Date: 09/14/2018 CLINICAL DATA:  Hematuria with abdominal pain EXAM: CT ABDOMEN AND PELVIS WITHOUT CONTRAST TECHNIQUE: Multidetector CT imaging of the abdomen and pelvis was performed following the standard protocol without IV contrast. COMPARISON:  CT abdomen pelvis 10/24/2005 FINDINGS: LOWER CHEST: There is no basilar pleural or apical  pericardial effusion. HEPATOBILIARY: 2.8 cm cyst in the left hepatic lobe, increased in size from prior examination. No other focal liver lesions. Normal contours. Status post cholecystectomy. PANCREAS: The pancreatic parenchymal contours are normal and there is no ductal dilatation. There is no peripancreatic fluid collection. SPLEEN: Normal. ADRENALS/URINARY TRACT: --Adrenal glands: Normal. --Right kidney/ureter: No hydronephrosis, nephroureterolithiasis, perinephric stranding or solid renal mass. --Left kidney/ureter: No hydronephrosis, nephroureterolithiasis, perinephric stranding or solid renal mass. --Urinary bladder: Normal for degree of distention STOMACH/BOWEL: --Stomach/Duodenum: There is no hiatal hernia or other gastric abnormality. The duodenal course and caliber are normal. --Small bowel: No dilatation or inflammation. --Colon: No focal abnormality. --Appendix: Normal. VASCULAR/LYMPHATIC: There is mild stranding surrounding the origins of the celiac axis and superior mesenteric artery. Otherwise normal appearance of the major abdominal vessels. No abdominal or pelvic lymphadenopathy. REPRODUCTIVE: Multiple partially calcified uterine fibroids, measuring up to 3 cm. Calcification is new compared to the prior CT. Normal adnexa. MUSCULOSKELETAL. No bony spinal canal stenosis or focal osseous abnormality. OTHER: None. IMPRESSION: 1. Mild stranding surrounding the origins of the celiac axis and  superior mesenteric artery, which may indicate a mild vasculitis. Vascular patency could be confirmed with CT angiography. 2. No obstructive uropathy or nephrolithiasis. 3. Multiple calcified uterine fibroids. Electronically Signed   By: Ulyses Jarred M.D.   On: 09/14/2018 23:09   Ct Angio Abd/pel W And/or Wo Contrast  Result Date: 09/15/2018 CLINICAL DATA:  53 year old female with abnormal noncontrast CT Abdomen and Pelvis earlier. Hematuria and abdominal pain. Query celiac and SMA vasculitis. EXAM: CTA ABDOMEN  AND PELVIS wITHOUT AND WITH CONTRAST TECHNIQUE: Multidetector CT imaging of the abdomen and pelvis was performed using the standard protocol during bolus administration of intravenous contrast. Multiplanar reconstructed images and MIPs were obtained and reviewed to evaluate the vascular anatomy. CONTRAST:  133mL ISOVUE-370 IOPAMIDOL (ISOVUE-370) INJECTION 76% COMPARISON:  CT Abdomen and Pelvis 09/14/2018. FINDINGS: VASCULAR Aorta: Patent without atherosclerosis, stenosis, wall thickening, aneurysm or dissection. Celiac: Patent without atherosclerosis or stenosis. Mild fat stranding about the celiac trunk. SMA: More moderate fat stranding about the proximal SMA which is mildly narrow it but remains patent (series 8, image 71 and series 9, image 120. No wall thickening. No atherosclerosis identified. SMA branches appear patent. Renals: Patent. Mildly bordered by the fat stranding around the SMA. IMA: Patent and within normal limits. Bilateral iliac arteries and proximal femoral arteries appear normal. Veins: On delayed images the portal venous system is patent. No abnormality of the SMV is identified. Review of the MIP images confirms the above findings. NON-VASCULAR Lower chest: Stable mild cardiomegaly and lung base atelectasis. Hepatobiliary: Simple fluid density central hepatic cyst. Surgically absent gallbladder. Pancreas: Pancreatic enhancement is within normal limits. Spleen: Negative. Adrenals/Urinary Tract: Normal adrenal glands. Bilateral renal enhancement is symmetric and within normal limits. Diminutive and unremarkable urinary bladder. Stomach/Bowel: Stable. Diverticulosis of the descending and sigmoid colon without active inflammation. Retained stool elsewhere. Negative retrocecal appendix. No dilated small bowel no free air, free fluid. Lymphatic: No lymphadenopathy. Reproductive: Stable, multiple calcified fibroids. Other: No pelvic free fluid. Musculoskeletal: No acute osseous abnormality identified.  Mild chronic appearing trauma to the anterior left pubic symphysis with dystrophic calcification of the muscle. IMPRESSION: VASCULAR 1. Persistent inflammatory stranding along the ventral Aorta at the SMA origin, mildly present at the celiac and renal origins. No Aortic or SMA wall thickening, dissection, atherosclerosis, or occlusion. 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 Early retroperitoneal fibrosis is felt less likely. 2. SMV and portal venous system appear normal. NON-VASCULAR Stable from the earlier CT Abdomen and Pelvis.  No new abnormality. Electronically Signed   By: Genevie Ann M.D.   On: 09/15/2018 00:47    Scheduled Meds: . amLODipine  5 mg Oral Daily  . atorvastatin  10 mg Oral Daily  . enoxaparin (LOVENOX) injection  40 mg Subcutaneous Q24H  . pantoprazole (PROTONIX) IV  40 mg Intravenous Q12H  . triamterene-hydrochlorothiazide  1 tablet Oral q morning - 10a   Continuous Infusions: . sodium chloride 75 mL/hr at 09/16/18 1002    Assessment/Plan:  1. Abdominal pain and nausea vomiting.  Patient is better than when she came in but still having pain.  Requested GI consult for endoscopy.  Patient placed on empiric Protonix. CT angio of the abdomen is negative for aortitis.  2. Hypertension.  On amlodipine and Maxide 3. Hyperlipidemia unspecified on atorvastatin 4. Obesity with a BMI of 37.12.  Weight loss needed  Code Status:     Code Status Orders  (From admission, onward)         Start  Ordered   09/15/18 0855  Full code  Continuous     09/15/18 0855        Code Status History    This patient has a current code status but no historical code status.      Disposition Plan: Reevaluate tomorrow  Consultants:  Rheumatology  Vascular surgery  GI consultation  Time spent: 28 minutes  [place]

## 2018-09-16 NOTE — Progress Notes (Signed)
CT of chest reviewed and shows no abnormalities. We can see in the office in 3-4 weeks in follow up if desired No other recs from vascular POV at this point.

## 2018-09-16 NOTE — Consult Note (Addendum)
Joy Antigua, MD [address], [place], [place], [place], [id number], [place], [place], [place], [address] Phone: [phone]  Fax: [phone]  Consultation  Referring Provider:     Dr. Bobbye Charleston Primary Care Physician:  Joy Curry, MD Reason for Consultation:     Abdominal pain  Date of Admission:  09/14/2018 Date of Consultation:  09/16/2018         HPI:   Joy Dunlap is a 53 y.o. female with 4 to 5-day history of diffuse abdominal pain, sharp, nonradiating, 7/10, associated with nausea.  One episode of emesis about 4 days ago with no blood.  Denies any previous history of similar symptoms.  Denies any sick contacts, no new medications.  Reports using NSAID about 2 to 3 weeks ago, about 4 to 5 pills over 1 to 2/week..  Denies any melena or hematochezia.  Denies any upper endoscopy.  Reports had a colonoscopy for screening with Dr. Jamal Dunlap about a month ago at [facility] and polyps were removed.  The procedure report for this is not available in her chart.  Previous history of abdominal surgeries include prior cholecystectomy.  Past Medical History:  Diagnosis Date  . Gallstones   . HLD (hyperlipidemia)   . Hypertension     Past Surgical History:  Procedure Laterality Date  . TUBAL LIGATION    . UTERINE FIBROID SURGERY      Prior to Admission medications   Medication Sig Start Date End Date Taking? Authorizing Provider  amLODipine (NORVASC) 5 MG tablet Take 5 mg by mouth daily. 06/16/18 06/16/19 Yes [provider]  atorvastatin (LIPITOR) 10 MG tablet Take 10 mg by mouth daily. 07/08/18 07/08/19 Yes [provider]  fluticasone (FLONASE) 50 MCG/ACT nasal spray Place 2 sprays into the nose daily. 07/07/18 07/07/19 Yes [provider]  ipratropium (ATROVENT) 0.03 % nasal spray Place 2 sprays into the nose 3 (three) times daily as needed. 06/04/18 06/04/19 Yes [provider]  triamterene-hydrochlorothiazide  (DYAZIDE) 37.5-25 MG capsule Take 1 capsule by mouth every morning. 06/16/18 06/16/19 Yes [provider]    Family History  Problem Relation Age of Onset  . Breast cancer Maternal Aunt 62  . Breast cancer Cousin        2 mat cousins  . Rheum arthritis Father      Social History   Tobacco Use  . Smoking status: Never Smoker  . Smokeless tobacco: Never Used  Substance Use Topics  . Alcohol use: No    Frequency: Never  . Drug use: No    Allergies as of 09/14/2018 - Review Complete 09/14/2018  Allergen Reaction Noted  . Flagyl [metronidazole] Hives 09/07/2017    Review of Systems:    All systems reviewed and negative except where noted in HPI.   Physical Exam:  Vital signs in last 24 hours: Vitals:   09/15/18 2004 09/16/18 0423 09/16/18 0803 09/16/18 0806  BP: 129/63 (!) 142/83 (!) 150/78 (!) 153/80  Pulse: (!) 58 (!) 58 62   Resp: 18 18 18    Temp: 98.5 F (36.9 C) 97.9 F (36.6 C) 98.1 F (36.7 C)   TempSrc: Oral Oral Oral   SpO2: 97% 96% 98%   Weight:      Height:       Last BM Date: 09/15/18 General:   Pleasant, cooperative in NAD Head:  Normocephalic and atraumatic. Eyes:   No icterus.   Conjunctiva pink. PERRLA. Ears:  Normal auditory acuity. Neck:  Supple;  no masses or thyroidomegaly Lungs: Respirations even and unlabored. Lungs clear to auscultation bilaterally.   No wheezes, crackles, or rhonchi.  Abdomen:  Soft, nondistended, mildly tender to palpation epigastric region. Normal bowel sounds. No appreciable masses or hepatomegaly.  No rebound or guarding.  Neurologic:  Alert and oriented x3;  grossly normal neurologically. Skin:  Intact without significant lesions or rashes. Cervical Nodes:  No significant cervical adenopathy. Psych:  Alert and cooperative. Normal affect.  LAB RESULTS: Recent Labs    09/14/18 2123 09/15/18 0906  WBC 10.6* 7.5  HGB 12.8 12.8  HCT 40.9 40.5  PLT 375 380   BMET Recent Labs    09/14/18 2123  09/15/18 0906  NA 140 138  K 3.8 3.2*  CL 103 104  CO2 27 27  GLUCOSE 112* 139*  BUN 14 12  CREATININE 0.99 0.83  CALCIUM 8.7* 8.5*   LFT Recent Labs    09/14/18 2123  PROT 7.7  ALBUMIN 3.9  AST 16  ALT 18  ALKPHOS 62  BILITOT 0.5   PT/INR No results for input(s): LABPROT, INR in the last 72 hours.  STUDIES: Ct Angio Chest Aorta W/cm &/or Wo/cm  Result Date: 09/16/2018 CLINICAL DATA:  Clinical concern for aortitis. EXAM: CT ANGIOGRAPHY CHEST WITH CONTRAST TECHNIQUE: Multidetector CT imaging of the chest was performed using the standard protocol during bolus administration of intravenous contrast. Multiplanar CT image reconstructions and MIPs were obtained to evaluate the vascular anatomy. CONTRAST:  68mL OMNIPAQUE IOHEXOL 350 MG/ML SOLN COMPARISON:  Chest radiograph 09/07/2017 FINDINGS: Cardiovascular: Satisfactory opacification of the pulmonary arteries to the segmental level. No evidence of pulmonary embolism. Normal heart size. No pericardial effusion. Normal appearance of the thoracic aorta. Small amount of air within the main pulmonary trunk and left ventricle, likely iatrogenic. Mediastinum/Nodes: No enlarged mediastinal, hilar, or axillary lymph nodes. Thyroid gland, trachea, and esophagus demonstrate no significant findings. Lungs/Pleura: Lungs are clear. No pleural effusion or pneumothorax. Lingular atelectasis versus scarring. Upper Abdomen: Benign-appearing liver cyst. The previously noted inflammatory fat stranding surrounding the origin SMA is only peripherally visualized. Musculoskeletal: No chest wall abnormality. No acute or significant osseous findings. Review of the MIP images confirms the above findings. IMPRESSION: 1. Normal appearance of the thoracic aorta. No evidence of pulmonary embolus. 2. Lingular atelectasis versus scarring. Electronically Signed   By: Fidela Salisbury M.D.   On: 09/16/2018 11:21   Ct Renal Stone Study  Result Date: 09/14/2018 CLINICAL  DATA:  Hematuria with abdominal pain EXAM: CT ABDOMEN AND PELVIS WITHOUT CONTRAST TECHNIQUE: Multidetector CT imaging of the abdomen and pelvis was performed following the standard protocol without IV contrast. COMPARISON:  CT abdomen pelvis 10/24/2005 FINDINGS: LOWER CHEST: There is no basilar pleural or apical pericardial effusion. HEPATOBILIARY: 2.8 cm cyst in the left hepatic lobe, increased in size from prior examination. No other focal liver lesions. Normal contours. Status post cholecystectomy. PANCREAS: The pancreatic parenchymal contours are normal and there is no ductal dilatation. There is no peripancreatic fluid collection. SPLEEN: Normal. ADRENALS/URINARY TRACT: --Adrenal glands: Normal. --Right kidney/ureter: No hydronephrosis, nephroureterolithiasis, perinephric stranding or solid renal mass. --Left kidney/ureter: No hydronephrosis, nephroureterolithiasis, perinephric stranding or solid renal mass. --Urinary bladder: Normal for degree of distention STOMACH/BOWEL: --Stomach/Duodenum: There is no hiatal hernia or other gastric abnormality. The duodenal course and caliber are normal. --Small bowel: No dilatation or inflammation. --Colon: No focal abnormality. --Appendix: Normal. VASCULAR/LYMPHATIC: There is mild stranding surrounding the origins of the celiac axis and superior mesenteric artery. Otherwise normal appearance of  the major abdominal vessels. No abdominal or pelvic lymphadenopathy. REPRODUCTIVE: Multiple partially calcified uterine fibroids, measuring up to 3 cm. Calcification is new compared to the prior CT. Normal adnexa. MUSCULOSKELETAL. No bony spinal canal stenosis or focal osseous abnormality. OTHER: None. IMPRESSION: 1. Mild stranding surrounding the origins of the celiac axis and superior mesenteric artery, which may indicate a mild vasculitis. Vascular patency could be confirmed with CT angiography. 2. No obstructive uropathy or nephrolithiasis. 3. Multiple calcified uterine  fibroids. Electronically Signed   By: Ulyses Jarred M.D.   On: 09/14/2018 23:09   Ct Angio Abd/pel W And/or Wo Contrast  Result Date: 09/15/2018 CLINICAL DATA:  53 year old female with abnormal noncontrast CT Abdomen and Pelvis earlier. Hematuria and abdominal pain. Query celiac and SMA vasculitis. EXAM: CTA ABDOMEN AND PELVIS wITHOUT AND WITH CONTRAST TECHNIQUE: Multidetector CT imaging of the abdomen and pelvis was performed using the standard protocol during bolus administration of intravenous contrast. Multiplanar reconstructed images and MIPs were obtained and reviewed to evaluate the vascular anatomy. CONTRAST:  162mL ISOVUE-370 IOPAMIDOL (ISOVUE-370) INJECTION 76% COMPARISON:  CT Abdomen and Pelvis 09/14/2018. FINDINGS: VASCULAR Aorta: Patent without atherosclerosis, stenosis, wall thickening, aneurysm or dissection. Celiac: Patent without atherosclerosis or stenosis. Mild fat stranding about the celiac trunk. SMA: More moderate fat stranding about the proximal SMA which is mildly narrow it but remains patent (series 8, image 71 and series 9, image 120. No wall thickening. No atherosclerosis identified. SMA branches appear patent. Renals: Patent. Mildly bordered by the fat stranding around the SMA. IMA: Patent and within normal limits. Bilateral iliac arteries and proximal femoral arteries appear normal. Veins: On delayed images the portal venous system is patent. No abnormality of the SMV is identified. Review of the MIP images confirms the above findings. NON-VASCULAR Lower chest: Stable mild cardiomegaly and lung base atelectasis. Hepatobiliary: Simple fluid density central hepatic cyst. Surgically absent gallbladder. Pancreas: Pancreatic enhancement is within normal limits. Spleen: Negative. Adrenals/Urinary Tract: Normal adrenal glands. Bilateral renal enhancement is symmetric and within normal limits. Diminutive and unremarkable urinary bladder. Stomach/Bowel: Stable. Diverticulosis of the  descending and sigmoid colon without active inflammation. Retained stool elsewhere. Negative retrocecal appendix. No dilated small bowel no free air, free fluid. Lymphatic: No lymphadenopathy. Reproductive: Stable, multiple calcified fibroids. Other: No pelvic free fluid. Musculoskeletal: No acute osseous abnormality identified. Mild chronic appearing trauma to the anterior left pubic symphysis with dystrophic calcification of the muscle. IMPRESSION: VASCULAR 1. Persistent inflammatory stranding along the ventral Aorta at the SMA origin, mildly present at the celiac and renal origins. No Aortic or SMA wall thickening, dissection, atherosclerosis, or occlusion. 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 Early retroperitoneal fibrosis is felt less likely. 2. SMV and portal venous system appear normal. NON-VASCULAR Stable from the earlier CT Abdomen and Pelvis.  No new abnormality. Electronically Signed   By: Genevie Ann M.D.   On: 09/15/2018 00:47      Impression / Plan:   ALYA SMALTZ is a 53 y.o. y/o female with abdominal pain  Dr. Earleen Newport would like the patient evaluated for upper endoscopy given ongoing pain despite conservative measures Patient was evaluated by Dr. dew of vascular surgery due to her above CT findings and as per his note the periaortic inflammation/possible vasculitis reported on the CT scan "is likely not the cause of her symptoms"  Vascular surgery does not recommend any interventions at this point  Her hemoglobin and liver enzymes are stable Imaging does not show any biliary  duct dilation  Given her NSAID use she may have developed gastritis, peptic ulcer disease that could result in her abdominal pain.  However, no episodes of GI bleeding.  She has been started on PPI to see if it helps her pain, and this is reasonable.  Given ongoing pain, can proceed with upper endoscopy tomorrow as I do not see any contraindications to proceeding  for the procedure.  I have contacted Mason the PA from [facility], will schedule patient for her screening colonoscopy and requested the colonoscopy report.  I will review the report to see if any of her polyps removed were larger removed with hot snare to evaluate if this could be possible post polypectomy syndrome, however this is less likely.  (They got back to me and transverse colon 6 mm polyp was removed with cold snare.  Pathology report and procedure report itself not available to me, but that was what was reported to me by the PA.  Therefore this is not from post polypectomy syndrome.)  I have discussed alternative options, risks & benefits,  which include, but are not limited to, bleeding, infection, perforation,respiratory complication & drug reaction.  The patient agrees with this plan & written consent will be obtained.     Thank you for involving me in the care of this patient.      LOS: 0 days   Virgel Manifold, MD  09/16/2018, 2:44 PM

## 2018-09-17 ENCOUNTER — Encounter
Admission: EM | Disposition: A | Discharge status: Home / Self Care | Payer: Self-pay | Source: Home / Self Care | Attending: Emergency Medicine

## 2018-09-17 ENCOUNTER — Encounter: Payer: Self-pay | Admitting: *Deleted

## 2018-09-17 ENCOUNTER — Observation Stay: Payer: BC Managed Care – PPO | Admitting: Anesthesiology

## 2018-09-17 DIAGNOSIS — K317 Polyp of stomach and duodenum: Secondary | ICD-10-CM | POA: Diagnosis not present

## 2018-09-17 DIAGNOSIS — R1084 Generalized abdominal pain: Secondary | ICD-10-CM

## 2018-09-17 HISTORY — PX: ESOPHAGOGASTRODUODENOSCOPY: SHX5428

## 2018-09-17 LAB — BASIC METABOLIC PANEL
Anion gap: 7 (ref 5–15)
BUN: 13 mg/dL (ref 6–20)
CO2: 29 mmol/L (ref 22–32)
Calcium: 7.8 mg/dL — ABNORMAL LOW (ref 8.9–10.3)
Chloride: 104 mmol/L (ref 98–111)
Creatinine, Ser: 0.9 mg/dL (ref 0.44–1.00)
GFR calc Af Amer: >60 mL/min (ref >60)
GFR calc non Af Amer: >60 mL/min (ref >60)
Glucose, Bld: 95 mg/dL (ref 70–99)
Potassium: 3.4 mmol/L — ABNORMAL LOW (ref 3.5–5.1)
Sodium: 140 mmol/L (ref 135–145)

## 2018-09-17 LAB — CARDIOLIPIN ANTIBODIES, IGM+IGG: Anticardiolipin IgG: <9 GPL U/mL (ref 0–14)

## 2018-09-17 LAB — MAGNESIUM: Magnesium: 2.1 mg/dL (ref 1.7–2.4)

## 2018-09-17 SURGERY — EGD (ESOPHAGOGASTRODUODENOSCOPY)
Anesthesia: General

## 2018-09-17 MED ORDER — BISACODYL 5 MG PO TBEC
5.0000 mg | DELAYED_RELEASE_TABLET | Freq: Once | ORAL | Status: AC
  Administered 2018-09-17: 23:00:00 5 mg via ORAL
  Filled 2018-09-17: qty 1

## 2018-09-17 MED ORDER — PROPOFOL 500 MG/50ML IV EMUL
INTRAVENOUS | Status: DC | PRN
  Administered 2018-09-17: 150 ug/kg/min via INTRAVENOUS

## 2018-09-17 MED ORDER — BISACODYL 5 MG PO TBEC
5.0000 mg | DELAYED_RELEASE_TABLET | Freq: Every day | ORAL | Status: DC | PRN
  Administered 2018-09-18: 5 mg via ORAL
  Filled 2018-09-17: qty 1

## 2018-09-17 MED ORDER — OXYCODONE HCL 5 MG PO TABS: 5.0000 mg | ORAL_TABLET | Freq: Four times a day (QID) | ORAL | 0 refills | Status: DC | PRN

## 2018-09-17 MED ORDER — LIDOCAINE HCL (CARDIAC) PF 100 MG/5ML IV SOSY
PREFILLED_SYRINGE | INTRAVENOUS | Status: DC | PRN
  Administered 2018-09-17: 50 mg via INTRAVENOUS

## 2018-09-17 MED ORDER — DICYCLOMINE HCL 20 MG PO TABS
20.0000 mg | ORAL_TABLET | Freq: Three times a day (TID) | ORAL | Status: DC
  Administered 2018-09-17 – 2018-09-18 (×4): 20 mg via ORAL
  Filled 2018-09-17 (×7): qty 1

## 2018-09-17 MED ORDER — BISACODYL 5 MG PO TBEC
5.0000 mg | DELAYED_RELEASE_TABLET | Freq: Once | ORAL | Status: AC
  Administered 2018-09-17: 5 mg via ORAL
  Filled 2018-09-17: qty 1

## 2018-09-17 MED ORDER — DICYCLOMINE HCL 20 MG PO TABS: 20.0000 mg | ORAL_TABLET | Freq: Three times a day (TID) | ORAL | 0 refills | Status: DC

## 2018-09-17 MED ORDER — SODIUM CHLORIDE 0.9 % IV SOLN
INTRAVENOUS | Status: DC
  Administered 2018-09-17: 11:00:00 1000 mL via INTRAVENOUS

## 2018-09-17 MED ORDER — OXYCODONE HCL 5 MG PO TABS
5.0000 mg | ORAL_TABLET | Freq: Four times a day (QID) | ORAL | Status: DC | PRN
  Administered 2018-09-18: 03:00:00 5 mg via ORAL
  Filled 2018-09-17: qty 1

## 2018-09-17 MED ORDER — OMEPRAZOLE 20 MG PO CPDR: 20.0000 mg | DELAYED_RELEASE_CAPSULE | Freq: Every day | ORAL | 0 refills | Status: DC

## 2018-09-17 MED ORDER — POTASSIUM CHLORIDE CRYS ER 20 MEQ PO TBCR
40.0000 meq | EXTENDED_RELEASE_TABLET | Freq: Once | ORAL | Status: AC
  Administered 2018-09-17: 40 meq via ORAL
  Filled 2018-09-17: qty 2

## 2018-09-17 MED ORDER — PROPOFOL 10 MG/ML IV BOLUS
INTRAVENOUS | Status: DC | PRN
  Administered 2018-09-17: 60 mg via INTRAVENOUS

## 2018-09-17 NOTE — Transfer of Care (Signed)
Immediate Anesthesia Transfer of Care Note  Patient: Joy Dunlap  Procedure(s) Performed: ESOPHAGOGASTRODUODENOSCOPY (EGD) (N/A )  Patient Location: PACU  Anesthesia Type:General  Level of Consciousness: drowsy  Airway & Oxygen Therapy: Patient Spontanous Breathing  Post-op Assessment: Report given to RN and Post -op Vital signs reviewed and stable  Post vital signs: Reviewed and stable  Last Vitals:  Vitals Value Taken Time  BP 114/75 09/17/2018  2:19 PM  Temp 36.2 C 09/17/2018  2:19 PM  Pulse 64 09/17/2018  2:20 PM  Resp 17 09/17/2018  2:20 PM  SpO2 96 % 09/17/2018  2:20 PM  Vitals shown include unvalidated device data.  Last Pain:  Vitals:   09/17/18 1419  TempSrc: Tympanic  PainSc:       Patients Stated Pain Goal: 0 ([id number])  Complications: No apparent anesthesia complications

## 2018-09-17 NOTE — Op Note (Signed)
[facility] Patient Name: Joy Dunlap Procedure Date: 09/17/2018 1:42 PM MRN: [id number] Account #: [id number] Date of Birth: 27-Nov-1965 Admit Type: Outpatient Age: 53 Room: [facility] ENDO ROOM 2 Gender: Female Note Status: Finalized Procedure:            Upper GI endoscopy Indications:          Generalized abdominal pain Providers:            Rett Stehlik B. Bonna Gains MD, MD Medicines:            Monitored Anesthesia Care Complications:        No immediate complications. Procedure:            Pre-Anesthesia Assessment:                       - The risks and benefits of the procedure and the                        sedation options and risks were discussed with the                        patient. All questions were answered and informed                        consent was obtained.                       - Patient identification and proposed procedure were                        verified prior to the procedure.                       - ASA Grade Assessment: II - A patient with mild                        systemic disease.                       After obtaining informed consent, the endoscope was                        passed under direct vision. Throughout the procedure,                        the patient's blood pressure, pulse, and oxygen                        saturations were monitored continuously. The Endoscope                        was introduced through the mouth, and advanced to the                        second part of duodenum. The upper GI endoscopy was                        accomplished with ease. The patient tolerated the                        procedure well. Findings:      The  gastroesophageal junction and examined esophagus were normal.      Localized mild mucosal changes characterized by nodularity were found in       the gastric antrum. Biopsies were taken with a cold forceps for       histology.      A single 4 mm sessile polyp with  no bleeding and no stigmata of recent       bleeding was found in the gastric body. Biopsies were taken with a cold       forceps for histology.      There is no endoscopic evidence of erythema or inflammatory changes       suggestive of gastritis or ulceration in the entire examined stomach.       Biopsies were obtained in the gastric body, at the incisura and in the       gastric antrum with cold forceps for Helicobacter pylori testing.      The examined duodenum was normal. Impression:           - Normal gastroesophageal junction and esophagus.                       - Nodular mucosa in the antrum. Biopsied.                       - A single gastric polyp. Biopsied.                       - Normal examined duodenum.                       - Biopsies were obtained in the gastric body, at the                        incisura and in the gastric antrum. Recommendation:       - Await pathology results.                       - No etiology of abdominal pain identified on today's                        exam. Abdominal pain likely related to CT findings                        showing possible vasculitis. Follow up with Vascular                        surgery. Primary team to consider rheumatology consult.                       - Advance diet as tolerated.                       - Continue present medications.                       - The findings and recommendations were discussed with                        the patient.                       - The findings and recommendations were discussed  with                        the patient's family. Procedure Code(s):    --- Professional ---                       [phone], Esophagogastroduodenoscopy, flexible, transoral;                        with biopsy, single or multiple Diagnosis Code(s):    --- Professional ---                       K31.89, Other diseases of stomach and duodenum                       K31.7, Polyp of stomach and duodenum                        R10.84, Generalized abdominal pain CPT copyright 2018 American Medical Association. All rights reserved. The codes documented in this report are preliminary and upon coder review may  be revised to meet current compliance requirements.  [name], MD Margretta Sidle B. Bonna Gains MD, MD 09/17/2018 2:19:26 PM This report has been signed electronically. Number of Addenda: 0 Note Initiated On: 09/17/2018 1:42 PM Estimated Blood Loss: Estimated blood loss: none.      [facility]

## 2018-09-17 NOTE — OR Nursing (Signed)
PT S/P UPPER ENDO. DX GASTRIC POLYP AND NODULE. RESULTS REPORTED TO [place] RN . PT TOLERATED PROCEDURE WELL. INFORMED RN PT EXPERIENCING CONSTIPATION THAT WILL BE ADDRESSED ON FLOOR. TRANSPORTED [place]

## 2018-09-17 NOTE — Anesthesia Procedure Notes (Signed)
Date/Time: 09/17/2018 2:01 PM Performed by: Johnna Acosta, CRNA Pre-anesthesia Checklist: Patient identified, Emergency Drugs available, Suction available, Patient being monitored and Timeout performed Patient Re-evaluated:Patient Re-evaluated prior to induction Oxygen Delivery Method: Nasal cannula Preoxygenation: Pre-oxygenation with 100% oxygen Induction Type: IV induction

## 2018-09-17 NOTE — Anesthesia Postprocedure Evaluation (Signed)
Anesthesia Post Note  Patient: Joy Dunlap  Procedure(s) Performed: ESOPHAGOGASTRODUODENOSCOPY (EGD) (N/A )  Patient location during evaluation: Endoscopy Anesthesia Type: General Level of consciousness: awake and alert Pain management: pain level controlled Vital Signs Assessment: post-procedure vital signs reviewed and stable Respiratory status: spontaneous breathing, nonlabored ventilation, respiratory function stable and patient connected to nasal cannula oxygen Cardiovascular status: blood pressure returned to baseline and stable Postop Assessment: no apparent nausea or vomiting Anesthetic complications: no     Last Vitals:  Vitals:   09/17/18 1107 09/17/18 1419  BP: (!) 159/96 114/69  Pulse: (!) 57 77  Resp: 20 18  Temp: 36.9 C (!) 36.2 C  SpO2:  96%    Last Pain:  Vitals:   09/17/18 1429  TempSrc:   PainSc: 0-No pain                 Precious Haws Ranny Wiebelhaus

## 2018-09-17 NOTE — Plan of Care (Signed)
Pt has been NPO since midnight for planned upper endoscopy. Education printed and provided. PT c/o moderate pain, currently NPO. Per MED, Joy Dunlap, may give PO pain medication with sip of water

## 2018-09-17 NOTE — Anesthesia Preprocedure Evaluation (Signed)
Anesthesia Evaluation  Patient identified by MRN, date of birth, ID band Patient awake    Reviewed: Allergy & Precautions, NPO status , Patient's Chart, lab work & pertinent test results  History of Anesthesia Complications Negative for: history of anesthetic complications  Airway Mallampati: IV  TM Distance: >3 FB Neck ROM: Full    Dental no notable dental hx.    Pulmonary neg pulmonary ROS, neg sleep apnea, neg COPD,    breath sounds clear to auscultation- rhonchi (-) wheezing      Cardiovascular Exercise Tolerance: Good hypertension, Pt. on medications (-) CAD, (-) Past MI, (-) Cardiac Stents and (-) CABG  Rhythm:Regular Rate:Normal - Systolic murmurs and - Diastolic murmurs    Neuro/Psych neg Seizures negative neurological ROS  negative psych ROS   GI/Hepatic negative GI ROS, Neg liver ROS,   Endo/Other  negative endocrine ROSneg diabetes  Renal/GU negative Renal ROS     Musculoskeletal negative musculoskeletal ROS (+)   Abdominal (+) + obese,   Peds  Hematology negative hematology ROS (+)   Anesthesia Other Findings Past Medical History: No date: Gallstones No date: HLD (hyperlipidemia) No date: Hypertension   Reproductive/Obstetrics                             Anesthesia Physical Anesthesia Plan  ASA: II  Anesthesia Plan: General   Post-op Pain Management:    Induction: Intravenous  PONV Risk Score and Plan: 2 and Propofol infusion  Airway Management Planned: Natural Airway  Additional Equipment:   Intra-op Plan:   Post-operative Plan:   Informed Consent: I have reviewed the patients History and Physical, chart, labs and discussed the procedure including the risks, benefits and alternatives for the proposed anesthesia with the patient or authorized representative who has indicated his/her understanding and acceptance.     Dental advisory given  Plan Discussed  with: CRNA and Anesthesiologist  Anesthesia Plan Comments:         Anesthesia Quick Evaluation

## 2018-09-17 NOTE — Discharge Instructions (Signed)

## 2018-09-17 NOTE — Anesthesia Post-op Follow-up Note (Signed)
Anesthesia QCDR form completed.        

## 2018-09-17 NOTE — Progress Notes (Signed)
Patient ID: Joy Dunlap, female   DOB: [birth year], 53 y.o.   MRN: [id number]  [facility] PROGRESS NOTE  MACARIA BIAS WUJ:[id number] DOB: 1965/10/05 DOA: 09/14/2018 PCP: Gayland Curry, MD  HPI/Subjective: Patient came back from endoscopy and states that she is any worse pain now than prior.  She states she was in pain all night.  She normally has 2 bowel movements a day and has not had one since the other day.  She is asking for something for constipation.  Objective: Vitals:   09/17/18 1419 09/17/18 1449  BP: 114/69 118/71  Pulse: 77 78  Resp: 18 19  Temp: (!) 97.2 F (36.2 C)   SpO2: 96% 99%      Filed Weights   09/14/18 2118 09/15/18 0900  Weight: 104.3 kg 104.3 kg    ROS: Review of Systems  Constitutional: Negative for chills and fever.  Eyes: Negative for blurred vision.  Respiratory: Negative for cough and shortness of breath.   Cardiovascular: Negative for chest pain.  Gastrointestinal: Positive for abdominal pain and constipation. Negative for diarrhea, nausea and vomiting.  Genitourinary: Negative for dysuria.  Musculoskeletal: Negative for joint pain.  Neurological: Negative for dizziness and headaches.   Exam: Physical Exam  Constitutional: She is oriented to person, place, and time.  HENT:  Nose: No mucosal edema.  Mouth/Throat: No oropharyngeal exudate or posterior oropharyngeal edema.  Eyes: Pupils are equal, round, and reactive to light. Conjunctivae, EOM and lids are normal.  Neck: No JVD present. Carotid bruit is not present. No edema present. No thyroid mass and no thyromegaly present.  Cardiovascular: S1 normal and S2 normal. Exam reveals no gallop.  No murmur heard. Pulses:      Dorsalis pedis pulses are 2+ on the right side and 2+ on the left side.  Respiratory: No respiratory distress. She has no wheezes. She has no rhonchi. She has no rales.  GI: Soft. Bowel sounds are normal. There is abdominal tenderness in the  periumbilical area.  Musculoskeletal:     Right ankle: She exhibits no swelling.     Left ankle: She exhibits no swelling.  Lymphadenopathy:    She has no cervical adenopathy.  Neurological: She is alert and oriented to person, place, and time. No cranial nerve deficit.  Skin: Skin is warm. No rash noted. Nails show no clubbing.  Psychiatric: She has a normal mood and affect.      Data Reviewed: Basic Metabolic Panel: Recent Labs  Lab 09/14/18 2123 09/15/18 0906 09/17/18 0338  NA 140 138 140  K 3.8 3.2* 3.4*  CL 103 104 104  CO2 27 27 29   GLUCOSE 112* 139* 95  BUN 14 12 13   CREATININE 0.99 0.83 0.90  CALCIUM 8.7* 8.5* 7.8*  MG  --   --  2.1   Liver Function Tests: Recent Labs  Lab 09/14/18 2123  AST 16  ALT 18  ALKPHOS 62  BILITOT 0.5  PROT 7.7  ALBUMIN 3.9   Recent Labs  Lab 09/14/18 2123  LIPASE 34   CBC: Recent Labs  Lab 09/14/18 2123 09/15/18 0906  WBC 10.6* 7.5  NEUTROABS 7.8*  --   HGB 12.8 12.8  HCT 40.9 40.5  MCV 84.0 83.9  PLT 375 380     Recent Results (from the past 240 hour(s))  Blood culture (routine x 2)     Status: None (Preliminary result)   Collection Time: 09/15/18  2:12 AM  Result Value Ref Range Status   Specimen  Description BLOOD RIGHT HAND  Final   Special Requests   Final    BOTTLES DRAWN AEROBIC AND ANAEROBIC Blood Culture adequate volume   Culture   Final    NO GROWTH 2 DAYS Performed at [facility], [address] [place], [place] [address]    Report Status PENDING  Incomplete  Blood culture (routine x 2)     Status: None (Preliminary result)   Collection Time: 09/15/18  2:13 AM  Result Value Ref Range Status   Specimen Description BLOOD LEFT HAND  Final   Special Requests   Final    BOTTLES DRAWN AEROBIC AND ANAEROBIC Blood Culture adequate volume   Culture   Final    NO GROWTH 2 DAYS Performed at [facility], [address], [place], [place] [address]    Report Status PENDING   Incomplete     Studies: Ct Angio Chest Aorta W/cm &/or Wo/cm  Result Date: 09/16/2018 CLINICAL DATA:  Clinical concern for aortitis. EXAM: CT ANGIOGRAPHY CHEST WITH CONTRAST TECHNIQUE: Multidetector CT imaging of the chest was performed using the standard protocol during bolus administration of intravenous contrast. Multiplanar CT image reconstructions and MIPs were obtained to evaluate the vascular anatomy. CONTRAST:  29mL OMNIPAQUE IOHEXOL 350 MG/ML SOLN COMPARISON:  Chest radiograph 09/07/2017 FINDINGS: Cardiovascular: Satisfactory opacification of the pulmonary arteries to the segmental level. No evidence of pulmonary embolism. Normal heart size. No pericardial effusion. Normal appearance of the thoracic aorta. Small amount of air within the main pulmonary trunk and left ventricle, likely iatrogenic. Mediastinum/Nodes: No enlarged mediastinal, hilar, or axillary lymph nodes. Thyroid gland, trachea, and esophagus demonstrate no significant findings. Lungs/Pleura: Lungs are clear. No pleural effusion or pneumothorax. Lingular atelectasis versus scarring. Upper Abdomen: Benign-appearing liver cyst. The previously noted inflammatory fat stranding surrounding the origin SMA is only peripherally visualized. Musculoskeletal: No chest wall abnormality. No acute or significant osseous findings. Review of the MIP images confirms the above findings. IMPRESSION: 1. Normal appearance of the thoracic aorta. No evidence of pulmonary embolus. 2. Lingular atelectasis versus scarring. Electronically Signed   By: Fidela Salisbury M.D.   On: 09/16/2018 11:21    Scheduled Meds: . amLODipine  5 mg Oral Daily  . atorvastatin  10 mg Oral Daily  . bisacodyl  5 mg Oral Once  . dicyclomine  20 mg Oral TID AC & HS  . enoxaparin (LOVENOX) injection  40 mg Subcutaneous Q24H  . pantoprazole (PROTONIX) IV  40 mg Intravenous Q12H  . potassium chloride  40 mEq Oral Once  . triamterene-hydrochlorothiazide  1 tablet Oral q  morning - 10a   Continuous Infusions:   Assessment/Plan:  1. Abdominal pain and nausea vomiting.  Patient in worsening pain.  Given 2 mg of morphine IV just before I saw her.  Will do a trial of Bentyl.  Endoscopy not revealing up much.  Give a Dulcolax tablet for constipation.  Patient placed on empiric Protonix. CT angio of the abdomen is negative for aortitis.  2. Hypertension.  On amlodipine and Maxide 3. Hyperlipidemia unspecified on atorvastatin 4. Obesity with a BMI of 37.12.  Weight loss needed  Code Status:     Code Status Orders  (From admission, onward)         Start     Ordered   09/15/18 0855  Full code  Continuous     09/15/18 0855        Code Status History    This patient has a current code status  but no historical code status.     Disposition Plan: We will call nurse a little bit later and see how she is doing.  May have to watch again overnight.  Consultants:  Rheumatology  Vascular surgery  GI consultation  Time spent: 51 minutes  [place]

## 2018-09-18 DIAGNOSIS — R1084 Generalized abdominal pain: Secondary | ICD-10-CM | POA: Diagnosis not present

## 2018-09-18 LAB — CBC
HCT: 40.6 % (ref 36.0–46.0)
Hemoglobin: 12.8 g/dL (ref 12.0–15.0)
MCH: 26.3 pg (ref 26.0–34.0)
MCHC: 31.5 g/dL (ref 30.0–36.0)
MCV: 83.5 fL (ref 80.0–100.0)
Platelets: 425 10*3/uL — ABNORMAL HIGH (ref 150–400)
RBC: 4.86 MIL/uL (ref 3.87–5.11)
RDW: 13.9 % (ref 11.5–15.5)
WBC: 11 10*3/uL — ABNORMAL HIGH (ref 4.0–10.5)
nRBC: 0 % (ref 0.0–0.2)

## 2018-09-18 NOTE — Discharge Summary (Signed)
[place] [facility] [place] NAME: Joy Dunlap    MR#:  [id number]  DATE OF BIRTH:  October 21, 1965  DATE OF ADMISSION:  09/14/2018 ADMITTING PHYSICIAN: Lance Coon, MD  DATE OF DISCHARGE: 09/18/2018  PRIMARY CARE PHYSICIAN: Gayland Curry, MD   ADMISSION DIAGNOSIS:  Vasculitis (North) [I77.6] Abdominal pain DISCHARGE DIAGNOSIS:  Principal Problem:   Abdominal pain, generalized Active Problems:   HTN (hypertension)   HLD (hyperlipidemia)   Gastric polyp   SECONDARY DIAGNOSIS:   Past Medical History:  Diagnosis Date  . Gallstones   . HLD (hyperlipidemia)   . Hypertension      ADMITTING HISTORY 53 year old female patient was admitted for abdominal pain.  Patient was worked up with CT abdomen which showed stranding around the aorta, celiac axis and renal arteries suggestive of vasculitis.  HOSPITAL COURSE:  Seen by gastroenterology and vascular surgery during hospitalization.  Was worked up with endoscopy which showed gastric polyp which was sent for biopsy.  He was worked up with CTA chest which showed normal appearance of thoracic aorta.  No evidence of pulmonary embolism.  Patient was put on trial of Bentyl for abdominal pain along with empirical proton pump inhibitor.  CT angiogram of the abdomen did not reveal any aortitis.  Patient's abdominal pain improved she tolerated diet well.  CONSULTS OBTAINED:  Treatment Team:  Lucilla Lame, MD  DRUG ALLERGIES:   Allergies  Allergen Reactions  . Flagyl [Metronidazole] Hives    DISCHARGE MEDICATIONS:   Allergies as of 09/18/2018      Reactions   Flagyl [metronidazole] Hives      Medication List    TAKE these medications   amLODipine 5 MG tablet Commonly known as:  NORVASC Take 5 mg by mouth daily.   atorvastatin 10 MG tablet Commonly known as:  LIPITOR Take 10 mg by mouth daily.   dicyclomine 20 MG tablet Commonly known as:  BENTYL Take 1 tablet (20 mg total) by mouth 4  (four) times daily -  before meals and at bedtime.   fluticasone 50 MCG/ACT nasal spray Commonly known as:  FLONASE Place 2 sprays into the nose daily.   ipratropium 0.03 % nasal spray Commonly known as:  ATROVENT Place 2 sprays into the nose 3 (three) times daily as needed.   omeprazole 20 MG capsule Commonly known as:  PRILOSEC Take 1 capsule (20 mg total) by mouth daily.   oxyCODONE 5 MG immediate release tablet Commonly known as:  Oxy IR/ROXICODONE Take 1 tablet (5 mg total) by mouth every 6 (six) hours as needed for moderate pain or severe pain.   triamterene-hydrochlorothiazide 37.5-25 MG capsule Commonly known as:  DYAZIDE Take 1 capsule by mouth every morning.       Today  Patient seen and evaluated today Complaints of abdominal pain No nausea and vomiting No chest pain Patient hemodynamically stable  VITAL SIGNS:  Blood pressure 134/84, pulse 65, temperature 98.5 F (36.9 C), temperature source Oral, resp. rate 20, height 5\' 6"  (1.676 m), weight 104.3 kg, SpO2 94 %.  I/O:    Intake/Output Summary (Last 24 hours) at 09/18/2018 1047 Last data filed at 09/17/2018 1413 Gross per 24 hour  Intake 400 ml  Output 0 ml  Net 400 ml    PHYSICAL EXAMINATION:  Physical Exam  GENERAL:  53 y.o.-year-old patient lying in the bed with no acute distress.  LUNGS: Normal breath sounds bilaterally, no wheezing, rales,rhonchi or crepitation. No use of accessory muscles of  respiration.  CARDIOVASCULAR: S1, S2 normal. No murmurs, rubs, or gallops.  ABDOMEN: Soft, non-tender, non-distended. Bowel sounds present. No organomegaly or mass.  NEUROLOGIC: Moves all 4 extremities. PSYCHIATRIC: The patient is alert and oriented x 3.  SKIN: No obvious rash, lesion, or ulcer.   DATA REVIEW:   CBC Recent Labs  Lab 09/18/18 0335  WBC 11.0*  HGB 12.8  HCT 40.6  PLT 425*    Chemistries  Recent Labs  Lab 09/14/18 2123  09/17/18 0338  NA 140   < > 140  K 3.8   < > 3.4*  CL  103   < > 104  CO2 27   < > 29  GLUCOSE 112*   < > 95  BUN 14   < > 13  CREATININE 0.99   < > 0.90  CALCIUM 8.7*   < > 7.8*  MG  --   --  2.1  AST 16  --   --   ALT 18  --   --   ALKPHOS 62  --   --   BILITOT 0.5  --   --    < > = values in this interval not displayed.    Cardiac Enzymes No results for input(s): TROPONINI in the last 168 hours.  Microbiology Results  Results for orders placed or performed during the hospital encounter of 09/14/18  Blood culture (routine x 2)     Status: None (Preliminary result)   Collection Time: 09/15/18  2:12 AM  Result Value Ref Range Status   Specimen Description BLOOD RIGHT HAND  Final   Special Requests   Final    BOTTLES DRAWN AEROBIC AND ANAEROBIC Blood Culture adequate volume   Culture   Final    NO GROWTH 3 DAYS Performed at [facility], [address] [place], [place] [address]    Report Status PENDING  Incomplete  Blood culture (routine x 2)     Status: None (Preliminary result)   Collection Time: 09/15/18  2:13 AM  Result Value Ref Range Status   Specimen Description BLOOD LEFT HAND  Final   Special Requests   Final    BOTTLES DRAWN AEROBIC AND ANAEROBIC Blood Culture adequate volume   Culture   Final    NO GROWTH 3 DAYS Performed at [facility], [address] [place], [place] [address]    Report Status PENDING  Incomplete    RADIOLOGY:  Ct Angio Chest Aorta W/cm &/or Wo/cm  Result Date: 09/16/2018 CLINICAL DATA:  Clinical concern for aortitis. EXAM: CT ANGIOGRAPHY CHEST WITH CONTRAST TECHNIQUE: Multidetector CT imaging of the chest was performed using the standard protocol during bolus administration of intravenous contrast. Multiplanar CT image reconstructions and MIPs were obtained to evaluate the vascular anatomy. CONTRAST:  59mL OMNIPAQUE IOHEXOL 350 MG/ML SOLN COMPARISON:  Chest radiograph 09/07/2017 FINDINGS: Cardiovascular: Satisfactory opacification of the pulmonary arteries to the  segmental level. No evidence of pulmonary embolism. Normal heart size. No pericardial effusion. Normal appearance of the thoracic aorta. Small amount of air within the main pulmonary trunk and left ventricle, likely iatrogenic. Mediastinum/Nodes: No enlarged mediastinal, hilar, or axillary lymph nodes. Thyroid gland, trachea, and esophagus demonstrate no significant findings. Lungs/Pleura: Lungs are clear. No pleural effusion or pneumothorax. Lingular atelectasis versus scarring. Upper Abdomen: Benign-appearing liver cyst. The previously noted inflammatory fat stranding surrounding the origin SMA is only peripherally visualized. Musculoskeletal: No chest wall abnormality. No acute or significant osseous findings. Review of the MIP images confirms  the above findings. IMPRESSION: 1. Normal appearance of the thoracic aorta. No evidence of pulmonary embolus. 2. Lingular atelectasis versus scarring. Electronically Signed   By: Fidela Salisbury M.D.   On: 09/16/2018 11:21    Follow up with PCP in 1 week.  Management plans discussed with the patient, family and they are in agreement.  CODE STATUS: Full code    Code Status Orders  (From admission, onward)         Start     Ordered   09/15/18 0855  Full code  Continuous     09/15/18 0855        Code Status History    This patient has a current code status but no historical code status.      TOTAL TIME TAKING CARE OF THIS PATIENT ON DAY OF DISCHARGE: more than 35 minutes.   Saundra Shelling M.D on 09/18/2018 at 10:47 AM  Between 7am to 6pm - Pager - [phone]  After 6pm go to [email or web address] - password [email or web address] [facility]  Office  [phone]  CC: Primary care physician; Gayland Curry, MD  Note: This dictation was prepared with Dragon dictation along with smaller phrase technology. Any transcriptional errors that result from this process are unintentional.

## 2018-09-18 NOTE — Progress Notes (Signed)
Reports feeling better; denies abdominal pain. States ready to go home. SEE AVS with oral and written education done.

## 2018-09-18 NOTE — Progress Notes (Signed)
Advanced care plan. Purpose of the Encounter: CODE STATUS Parties in Attendance:Patient Patient's Decision Capacity:Good Subjective/Patient's story: Presented for abdominal pain Objective/Medical story Needs work up for vasculitis Needs imaging studies Goals of care determination:  Advance care directives and goals of care discussed Patient wants everything done which includes cpr, intubation and ventilator if need arises. CODE STATUS: Full code Time spent discussing advanced care planning: 16 minutes

## 2018-09-19 LAB — POCT PREGNANCY, URINE: Preg Test, Ur: NEGATIVE

## 2018-09-19 LAB — CRYOGLOBULIN

## 2018-09-20 ENCOUNTER — Encounter: Payer: Self-pay | Admitting: Gastroenterology

## 2018-09-20 LAB — CULTURE, BLOOD (ROUTINE X 2)
CULTURE: NO GROWTH
CULTURE: NO GROWTH
Special Requests: ADEQUATE
Special Requests: ADEQUATE

## 2018-09-22 ENCOUNTER — Other Ambulatory Visit: Payer: Self-pay | Admitting: Gastroenterology

## 2018-09-22 MED ORDER — OMEPRAZOLE 20 MG PO CPDR: 20.0000 mg | DELAYED_RELEASE_CAPSULE | Freq: Two times a day (BID) | ORAL | 0 refills | Status: DC

## 2018-09-22 MED ORDER — AMOXICILLIN 500 MG PO TABS: 1000.0000 mg | ORAL_TABLET | Freq: Two times a day (BID) | ORAL | 0 refills | Status: AC

## 2018-09-22 MED ORDER — CLARITHROMYCIN 500 MG PO TABS: 500.0000 mg | ORAL_TABLET | Freq: Two times a day (BID) | ORAL | 0 refills | Status: AC

## 2018-09-23 LAB — SURGICAL PATHOLOGY

## 2018-10-11 ENCOUNTER — Ambulatory Visit: Payer: BC Managed Care – PPO | Admitting: Gastroenterology

## 2018-10-25 ENCOUNTER — Other Ambulatory Visit: Payer: Self-pay

## 2018-10-25 ENCOUNTER — Telehealth: Payer: Self-pay | Admitting: Gastroenterology

## 2018-10-25 ENCOUNTER — Ambulatory Visit (INDEPENDENT_AMBULATORY_CARE_PROVIDER_SITE_OTHER): Payer: BC Managed Care – PPO | Admitting: Gastroenterology

## 2018-10-25 ENCOUNTER — Encounter: Payer: Self-pay | Admitting: Gastroenterology

## 2018-10-25 DIAGNOSIS — R1084 Generalized abdominal pain: Secondary | ICD-10-CM

## 2018-10-25 NOTE — Progress Notes (Signed)
Joy Antigua, MD [address]  [place]  [place], [place] [address]  Main: [phone]  Fax: [phone]   Primary Care Physician: Gayland Curry, MD  Virtual Visit via Telephone Note  I connected with patient on 10/25/18 at 10:10 AM EDT by telephone and verified that I am speaking with the correct person using two identifiers.   I discussed the limitations, risks, security and privacy concerns of performing an evaluation and management service by telephone and the availability of in person appointments. I also discussed with the patient that there may be a patient responsible charge related to this service. The patient expressed understanding and agreed to proceed.  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 female recently seen as an inpatient for abdominal pain and underwent EGD that showed nodular mucosa in the gastric antrum, single gastric polyp, otherwise normal.  Biopsies showed H. pylori.  Triple therapy was sent over to her pharmacy, but patient did not start taking these medications right away and but it appears she saw her primary care provider recently and after clarifying the medication she has just started taking it and states she has about 1 more week of the triple therapy left.  She states her abdominal pain has completely resolved. The patient denies abdominal or flank pain, anorexia, nausea or vomiting, dysphagia, change in bowel habits or black or bloody stools or weight loss.   Current Outpatient Medications  Medication Sig Dispense Refill  . amLODipine (NORVASC) 5 MG tablet Take 5 mg by mouth daily.    Marland Kitchen atorvastatin (LIPITOR) 10 MG tablet Take 10 mg by mouth daily.    Marland Kitchen dicyclomine (BENTYL) 20 MG tablet Take 1 tablet (20 mg total) by mouth 4 (four) times daily -   before meals and at bedtime. 120 tablet 0  . triamterene-hydrochlorothiazide (DYAZIDE) 37.5-25 MG capsule Take 1 capsule by mouth every morning.    . fluticasone (FLONASE) 50 MCG/ACT nasal spray Place 2 sprays into the nose daily.    Marland Kitchen ipratropium (ATROVENT) 0.03 % nasal spray Place 2 sprays into the nose 3 (three) times daily as needed.    Marland Kitchen omeprazole (PRILOSEC) 20 MG capsule Take 1 capsule (20 mg total) by mouth 2 (two) times daily for 14 days. 28 capsule 0  . oxyCODONE (OXY IR/ROXICODONE) 5 MG immediate release tablet Take 1 tablet (5 mg total) by mouth every 6 (six) hours as needed for moderate pain or severe pain. (Patient not taking: Reported on 10/25/2018) 30 tablet 0   No current facility-administered medications for this visit.     Allergies as of 10/25/2018 - Review Complete 10/25/2018  Allergen Reaction Noted  . Flagyl [metronidazole] Hives 09/07/2017    Review of Systems:    All systems reviewed and negative except where noted in HPI.   Observations/Objective:  Labs: CMP     Component Value Date/Time   NA 140 09/17/2018 0338   NA 140 05/22/2013 2126   K 3.4 (L) 09/17/2018 0338   K 3.6 05/22/2013 2126   CL 104 09/17/2018 0338   CL 107 05/22/2013 2126   CO2 29 09/17/2018 0338   CO2 27 05/22/2013 2126   GLUCOSE 95 09/17/2018 0338   GLUCOSE 95 05/22/2013 2126   BUN 13 09/17/2018 0338   BUN 13 05/22/2013 2126   CREATININE 0.90  09/17/2018 0338   CREATININE 0.97 05/22/2013 2126   CALCIUM 7.8 (L) 09/17/2018 0338   CALCIUM 8.7 05/22/2013 2126   PROT 7.7 09/14/2018 2123   PROT 7.7 05/22/2013 2126   ALBUMIN 3.9 09/14/2018 2123   ALBUMIN 3.5 05/22/2013 2126   AST 16 09/14/2018 2123   AST 15 05/22/2013 2126   ALT 18 09/14/2018 2123   ALT 20 05/22/2013 2126   ALKPHOS 62 09/14/2018 2123   ALKPHOS 81 05/22/2013 2126   BILITOT 0.5 09/14/2018 2123   BILITOT 0.4 05/22/2013 2126   GFRNONAA >60 09/17/2018 0338   GFRNONAA >60 05/22/2013 2126   GFRAA >60 09/17/2018 0338    GFRAA >60 05/22/2013 2126   Lab Results  Component Value Date   WBC 11.0 (H) 09/18/2018   HGB 12.8 09/18/2018   HCT 40.6 09/18/2018   MCV 83.5 09/18/2018   PLT 425 (H) 09/18/2018    Imaging Studies: No results found.  Assessment and Plan:   [name] is a 53 y.o. y/o female here for follow-up of abdominal pain and H. pylori  Assessment and Plan: Abdominal pain has completely resolved with triple therapy  Patient will need eradication testing, which has been ordered for 4 to 6 weeks out from now.  If this is positive she will need quadruple therapy, otherwise no further therapy indicated if testing is negative since patient has resolution of symptoms as well  Follow Up Instructions: Colonoscopy up-to-date and done by [facility] clinic  Follow-up clinic visit in 6 months   I discussed the assessment and treatment plan with the patient. The patient was provided an opportunity to ask questions and all were answered. The patient agreed with the plan and demonstrated an understanding of the instructions.   The patient was advised to call back or seek an in-person evaluation if the symptoms worsen or if the condition fails to improve as anticipated.  I provided 7 minutes of non-face-to-face time during this encounter.   Virgel Manifold, MD  Speech recognition software was used to dictate this note.

## 2018-10-25 NOTE — Telephone Encounter (Signed)
Called pt to schedule 3 month f/u apt pt was not aware or told she was supposed to f/u in 3 month she states she was just told to have Labs done she states she will call us back

## 2018-10-25 NOTE — Telephone Encounter (Signed)
-----   Message from Martie Lee, LPN sent at 02/05/1974 10:52 AM EDT ----- Regarding: f/u visit F/u clinic visit 3 months.

## 2018-11-02 ENCOUNTER — Ambulatory Visit: Payer: BC Managed Care – PPO | Admitting: Gastroenterology

## 2018-12-09 ENCOUNTER — Emergency Department: Payer: BC Managed Care – PPO

## 2018-12-09 ENCOUNTER — Other Ambulatory Visit: Payer: Self-pay

## 2018-12-09 ENCOUNTER — Emergency Department
Admission: EM | Admit: 2018-12-09 | Discharge: 2018-12-09 | Disposition: A | Discharge status: Home / Self Care | Payer: BC Managed Care – PPO | Attending: Emergency Medicine | Admitting: Emergency Medicine

## 2018-12-09 ENCOUNTER — Encounter: Payer: Self-pay | Admitting: Emergency Medicine

## 2018-12-09 DIAGNOSIS — S63502A Unspecified sprain of left wrist, initial encounter: Secondary | ICD-10-CM | POA: Insufficient documentation

## 2018-12-09 DIAGNOSIS — S39012A Strain of muscle, fascia and tendon of lower back, initial encounter: Secondary | ICD-10-CM | POA: Insufficient documentation

## 2018-12-09 DIAGNOSIS — I10 Essential (primary) hypertension: Secondary | ICD-10-CM | POA: Diagnosis not present

## 2018-12-09 DIAGNOSIS — S161XXA Strain of muscle, fascia and tendon at neck level, initial encounter: Secondary | ICD-10-CM | POA: Insufficient documentation

## 2018-12-09 DIAGNOSIS — Y92410 Unspecified street and highway as the place of occurrence of the external cause: Secondary | ICD-10-CM | POA: Diagnosis not present

## 2018-12-09 DIAGNOSIS — Y998 Other external cause status: Secondary | ICD-10-CM | POA: Diagnosis not present

## 2018-12-09 DIAGNOSIS — Y93I9 Activity, other involving external motion: Secondary | ICD-10-CM | POA: Insufficient documentation

## 2018-12-09 DIAGNOSIS — Z79899 Other long term (current) drug therapy: Secondary | ICD-10-CM | POA: Diagnosis not present

## 2018-12-09 DIAGNOSIS — S46912A Strain of unspecified muscle, fascia and tendon at shoulder and upper arm level, left arm, initial encounter: Secondary | ICD-10-CM | POA: Insufficient documentation

## 2018-12-09 DIAGNOSIS — S199XXA Unspecified injury of neck, initial encounter: Secondary | ICD-10-CM | POA: Diagnosis present

## 2018-12-09 MED ORDER — BACLOFEN 10 MG PO TABS: 10.0000 mg | ORAL_TABLET | Freq: Three times a day (TID) | ORAL | 1 refills | Status: AC

## 2018-12-09 MED ORDER — MELOXICAM 15 MG PO TABS: 15.0000 mg | ORAL_TABLET | Freq: Every day | ORAL | 2 refills | Status: AC
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
## 2018-12-09 NOTE — ED Notes (Signed)
pateint is gone to xray now.

## 2019-09-14 ENCOUNTER — Other Ambulatory Visit: Payer: Self-pay | Admitting: Family Medicine

## 2019-09-14 DIAGNOSIS — Z1231 Encounter for screening mammogram for malignant neoplasm of breast: Secondary | ICD-10-CM

## 2019-09-21 ENCOUNTER — Inpatient Hospital Stay: Admission: RE | Admit: 2019-09-21 | Payer: BC Managed Care – PPO | Source: Ambulatory Visit

## 2019-10-26 ENCOUNTER — Other Ambulatory Visit: Payer: Self-pay

## 2019-10-26 ENCOUNTER — Encounter: Payer: Self-pay | Admitting: Obstetrics and Gynecology

## 2019-10-26 ENCOUNTER — Ambulatory Visit (INDEPENDENT_AMBULATORY_CARE_PROVIDER_SITE_OTHER): Payer: BC Managed Care – PPO | Admitting: Obstetrics and Gynecology

## 2019-10-26 VITALS — BP 146/81 | HR 88 | Ht 66.0 in | Wt 251.2 lb

## 2019-10-26 DIAGNOSIS — R634 Abnormal weight loss: Secondary | ICD-10-CM | POA: Diagnosis not present

## 2019-10-26 MED ORDER — PHENTERMINE HCL 37.5 MG PO CAPS: 37.5000 mg | ORAL_CAPSULE | ORAL | 0 refills | Status: DC

## 2019-10-26 NOTE — Progress Notes (Signed)
HPI:      [name] is a 54 y.o. No obstetric history on file. who LMP was No LMP recorded. (Menstrual status: Perimenopausal).  Subjective:   She presents today to discuss weight loss options.  Patient has tried low-carb diet and keto diet without success. Currently exercises approximately 2 times per week. No current dietary restrictions.    Hx: The following portions of the patient's history were reviewed and updated as appropriate:             She  has a past medical history of Gallstones, HLD (hyperlipidemia), and Hypertension. She does not have any pertinent problems on file. She  has a past surgical history that includes Tubal ligation; Uterine fibroid surgery; and Esophagogastroduodenoscopy (N/A, 09/17/2018). Her family history includes Breast cancer in her cousin; Breast cancer (age of onset: 26) in her maternal aunt; Rheum arthritis in her father. She  reports that she has never smoked. She has never used smokeless tobacco. She reports that she does not drink alcohol or use drugs. She has a current medication list which includes the following prescription(s): baclofen, amlodipine, atorvastatin, dicyclomine, fluticasone, meloxicam, phentermine, and triamterene-hydrochlorothiazide. She is allergic to flagyl [metronidazole].       Review of Systems:  Review of Systems  Constitutional: Denied constitutional symptoms, night sweats, recent illness, fatigue, fever, insomnia and weight loss.  Eyes: Denied eye symptoms, eye pain, photophobia, vision change and visual disturbance.  Ears/Nose/Throat/Neck: Denied ear, nose, throat or neck symptoms, hearing loss, nasal discharge, sinus congestion and sore throat.  Cardiovascular: Denied cardiovascular symptoms, arrhythmia, chest pain/pressure, edema, exercise intolerance, orthopnea and palpitations.  Respiratory: Denied pulmonary symptoms, asthma, pleuritic pain, productive sputum, cough, dyspnea and wheezing.  Gastrointestinal:  Denied, gastro-esophageal reflux, melena, nausea and vomiting.  Genitourinary: Denied genitourinary symptoms including symptomatic vaginal discharge, pelvic relaxation issues, and urinary complaints.  Musculoskeletal: Denied musculoskeletal symptoms, stiffness, swelling, muscle weakness and myalgia.  Dermatologic: Denied dermatology symptoms, rash and scar.  Neurologic: Denied neurology symptoms, dizziness, headache, neck pain and syncope.  Psychiatric: Denied psychiatric symptoms, anxiety and depression.  Endocrine: Denied endocrine symptoms including hot flashes and night sweats.   Meds:   Current Outpatient Medications on File Prior to Visit  Medication Sig Dispense Refill  . baclofen (LIORESAL) 10 MG tablet Take 1 tablet (10 mg total) by mouth 3 (three) times daily. 90 tablet 1  . amLODipine (NORVASC) 5 MG tablet Take 5 mg by mouth daily.    Marland Kitchen atorvastatin (LIPITOR) 10 MG tablet Take 10 mg by mouth daily.    Marland Kitchen dicyclomine (BENTYL) 20 MG tablet Take 1 tablet (20 mg total) by mouth 4 (four) times daily -  before meals and at bedtime. (Patient not taking: Reported on 10/26/2019) 120 tablet 0  . fluticasone (FLONASE) 50 MCG/ACT nasal spray Place 2 sprays into the nose daily.    . meloxicam (MOBIC) 15 MG tablet Take 1 tablet (15 mg total) by mouth daily. (Patient not taking: Reported on 10/26/2019) 30 tablet 2  . triamterene-hydrochlorothiazide (DYAZIDE) 37.5-25 MG capsule Take 1 capsule by mouth every morning.     No current facility-administered medications on file prior to visit.    Objective:     Vitals:   10/26/19 0915  BP: (!) 146/81  Pulse: 88                Assessment:    No obstetric history on file. Patient Active Problem List   Diagnosis Date Noted  . Gastric polyp   .  Abdominal pain, generalized 09/15/2018  . HTN (hypertension) 09/15/2018  . HLD (hyperlipidemia) 09/15/2018     1. Weight loss      Plan:            1.  Discussed the long-term aspect of  weight loss and the necessity of lifestyle change involved in losing weight over the course of multiple months to years.  2.  Necessity of diet and exercise in combination discussed.  This includes a discussion regarding counting calories and portion sizes as well as exercising 5 times per week.  The concept of calories in versus calories burned and weight loss discussed.  3.  Usefulness of losing weight with other family members or weight watchers discussed.  4.  Use of medications like phentermine discussed.  These medications have the ability to help with weight loss but still require dietary modification and exercise.  5.  Expectation of weight loss monthly discussed.  If patient does not meet expectations a possible break from phentermine followed by restart or simply discontinuation of phentermine discussed.   Orders No orders of the defined types were placed in this encounter.    Meds ordered this encounter  Medications  . phentermine 37.5 MG capsule    Sig: Take 1 capsule (37.5 mg total) by mouth every morning.    Dispense:  30 capsule    Refill:  0      F/U  No follow-ups on file. I spent 35 minutes involved in the care of this patient preparing to see the patient by obtaining and reviewing her medical history (including labs, imaging tests and prior procedures), documenting clinical information in the electronic health record (EHR), counseling and coordinating care plans, writing and sending prescriptions, ordering tests or procedures and directly communicating with the patient by discussing pertinent items from her history and physical exam as well as detailing my assessment and plan as noted above so that she has an informed understanding.  All of her questions were answered.  Finis Bud, M.D. 10/26/2019 10:07 AM

## 2019-11-07 ENCOUNTER — Telehealth: Payer: Self-pay | Admitting: Obstetrics and Gynecology

## 2019-11-07 DIAGNOSIS — R634 Abnormal weight loss: Secondary | ICD-10-CM

## 2019-11-07 NOTE — Telephone Encounter (Signed)
Patient called in saying she was informed by Dr.Evans to take half a pill, however the prescription he sent in was for capsules instead of tablets. Pharmacy informed her she couldn't take half a capsule, that she would need to call our office and request that Dr. Amalia Hailey prescribes medication in tablet form.

## 2019-11-08 NOTE — Telephone Encounter (Signed)
Pt called in and stated that she talked the pharmacy and they said they haven't gotten the refill for her phentermine in tablet form. [facility] [place].

## 2019-11-08 NOTE — Telephone Encounter (Signed)
Can you resend in the Phentermine in a tablet form.

## 2019-11-09 MED ORDER — PHENTERMINE HCL 37.5 MG PO TABS: 37.5000 mg | ORAL_TABLET | Freq: Every day | ORAL | 0 refills | Status: DC

## 2019-11-09 NOTE — Addendum Note (Signed)
Addended by: Finis Bud on: 11/09/2019 02:46 PM   Modules accepted: Orders

## 2019-11-22 ENCOUNTER — Encounter: Payer: BC Managed Care – PPO | Admitting: Obstetrics and Gynecology

## 2019-11-29 ENCOUNTER — Ambulatory Visit
Admission: RE | Admit: 2019-11-29 | Discharge: 2019-11-29 | Disposition: A | Discharge status: Home / Self Care | Payer: BC Managed Care – PPO | Source: Ambulatory Visit | Attending: Family Medicine | Admitting: Family Medicine

## 2019-11-29 ENCOUNTER — Other Ambulatory Visit: Payer: Self-pay

## 2019-11-29 DIAGNOSIS — Z1231 Encounter for screening mammogram for malignant neoplasm of breast: Secondary | ICD-10-CM | POA: Diagnosis not present

## 2019-11-30 ENCOUNTER — Other Ambulatory Visit: Payer: Self-pay | Admitting: Family Medicine

## 2019-12-01 ENCOUNTER — Encounter: Payer: BC Managed Care – PPO | Admitting: Obstetrics and Gynecology

## 2019-12-02 ENCOUNTER — Other Ambulatory Visit: Payer: Self-pay | Admitting: Family Medicine

## 2019-12-02 DIAGNOSIS — R928 Other abnormal and inconclusive findings on diagnostic imaging of breast: Secondary | ICD-10-CM

## 2019-12-20 ENCOUNTER — Ambulatory Visit
Admission: RE | Admit: 2019-12-20 | Discharge: 2019-12-20 | Disposition: A | Discharge status: Home / Self Care | Payer: BC Managed Care – PPO | Source: Ambulatory Visit | Attending: Family Medicine | Admitting: Family Medicine

## 2019-12-20 ENCOUNTER — Ambulatory Visit (INDEPENDENT_AMBULATORY_CARE_PROVIDER_SITE_OTHER): Payer: BC Managed Care – PPO | Admitting: Obstetrics and Gynecology

## 2019-12-20 ENCOUNTER — Other Ambulatory Visit: Payer: Self-pay

## 2019-12-20 ENCOUNTER — Encounter: Payer: Self-pay | Admitting: Obstetrics and Gynecology

## 2019-12-20 VITALS — BP 158/90 | HR 64 | Ht 66.0 in | Wt 237.6 lb

## 2019-12-20 DIAGNOSIS — R928 Other abnormal and inconclusive findings on diagnostic imaging of breast: Secondary | ICD-10-CM

## 2019-12-20 DIAGNOSIS — R634 Abnormal weight loss: Secondary | ICD-10-CM | POA: Diagnosis not present

## 2019-12-20 NOTE — Progress Notes (Signed)
HPI:      [name] is a 54 y.o. No obstetric history on file. who LMP was No LMP recorded. (Menstrual status: Perimenopausal).  Subjective:   She presents today after beginning weight loss medication(phentermine).  There was an initial mixup of her medication and she did not begin until the second week of April.  Since her last visit she has discontinued soft drinks and has regulated her diet as well as increasing her exercise.  She believes that the phentermine helps her " mind/attitude" and makes her more consistent with her diet.    Hx: The following portions of the patient's history were reviewed and updated as appropriate:             She  has a past medical history of Gallstones, HLD (hyperlipidemia), and Hypertension. She does not have any pertinent problems on file. She  has a past surgical history that includes Tubal ligation; Uterine fibroid surgery; and Esophagogastroduodenoscopy (N/A, 09/17/2018). Her family history includes Breast cancer in her cousin; Breast cancer (age of onset: 82) in her maternal aunt; Rheum arthritis in her father. She  reports that she has never smoked. She has never used smokeless tobacco. She reports that she does not drink alcohol or use drugs. She has a current medication list which includes the following prescription(s): phentermine, amlodipine, atorvastatin, dicyclomine, fluticasone, and triamterene-hydrochlorothiazide. She is allergic to flagyl [metronidazole].       Review of Systems:  Review of Systems  Constitutional: Denied constitutional symptoms, night sweats, recent illness, fatigue, fever, insomnia and weight loss.  Eyes: Denied eye symptoms, eye pain, photophobia, vision change and visual disturbance.  Ears/Nose/Throat/Neck: Denied ear, nose, throat or neck symptoms, hearing loss, nasal discharge, sinus congestion and sore throat.  Cardiovascular: Denied cardiovascular symptoms, arrhythmia, chest pain/pressure, edema, exercise  intolerance, orthopnea and palpitations.  Respiratory: Denied pulmonary symptoms, asthma, pleuritic pain, productive sputum, cough, dyspnea and wheezing.  Gastrointestinal: Denied, gastro-esophageal reflux, melena, nausea and vomiting.  Genitourinary: Denied genitourinary symptoms including symptomatic vaginal discharge, pelvic relaxation issues, and urinary complaints.  Musculoskeletal: Denied musculoskeletal symptoms, stiffness, swelling, muscle weakness and myalgia.  Dermatologic: Denied dermatology symptoms, rash and scar.  Neurologic: Denied neurology symptoms, dizziness, headache, neck pain and syncope.  Psychiatric: Denied psychiatric symptoms, anxiety and depression.  Endocrine: Denied endocrine symptoms including hot flashes and night sweats.   Meds:   Current Outpatient Medications on File Prior to Visit  Medication Sig Dispense Refill  . phentermine (ADIPEX-P) 37.5 MG tablet Take 1 tablet (37.5 mg total) by mouth daily before breakfast. 30 tablet 0  . amLODipine (NORVASC) 5 MG tablet Take 5 mg by mouth daily.    Marland Kitchen atorvastatin (LIPITOR) 10 MG tablet Take 10 mg by mouth daily.    Marland Kitchen dicyclomine (BENTYL) 20 MG tablet Take 1 tablet (20 mg total) by mouth 4 (four) times daily -  before meals and at bedtime. (Patient not taking: Reported on 12/20/2019) 120 tablet 0  . fluticasone (FLONASE) 50 MCG/ACT nasal spray Place 2 sprays into the nose daily.    Marland Kitchen triamterene-hydrochlorothiazide (DYAZIDE) 37.5-25 MG capsule Take 1 capsule by mouth every morning.     No current facility-administered medications on file prior to visit.    Objective:     Vitals:   12/20/19 0956  BP: (!) 158/90  Pulse: 64              14 pound weight loss in approximately 6 weeks.  Assessment:    No obstetric history on  file. Patient Active Problem List   Diagnosis Date Noted  . Gastric polyp   . Abdominal pain, generalized 09/15/2018  . HTN (hypertension) 09/15/2018  . HLD (hyperlipidemia) 09/15/2018      1. Weight loss     Patient showing good progress on phentermine, diet and exercise.   Plan:            1.  Continue phentermine-patient encouraged in her dietary restrictions as well as exercise  2.  Discussed patient taking blood pressure medicine as prescribed. Orders No orders of the defined types were placed in this encounter.   No orders of the defined types were placed in this encounter.     F/U  Return in about 6 weeks (around 01/31/2020). I spent 21 minutes involved in the care of this patient preparing to see the patient by obtaining and reviewing her medical history (including labs, imaging tests and prior procedures), documenting clinical information in the electronic health record (EHR), counseling and coordinating care plans, writing and sending prescriptions, ordering tests or procedures and directly communicating with the patient by discussing pertinent items from her history and physical exam as well as detailing my assessment and plan as noted above so that she has an informed understanding.  All of her questions were answered.  Joy Dunlap, M.D. 12/20/2019 10:40 AM

## 2019-12-22 ENCOUNTER — Telehealth: Payer: Self-pay | Admitting: Obstetrics and Gynecology

## 2019-12-22 NOTE — Telephone Encounter (Signed)
Please send Phentermine to the pharmacy.

## 2019-12-22 NOTE — Telephone Encounter (Signed)
Pt called in and stated that she needs her phentermine   sent in. The pt stated she was seen Tuesday of this week. The [facility] uses [facility] on [address]. Please advise

## 2019-12-23 ENCOUNTER — Telehealth: Payer: Self-pay | Admitting: Obstetrics and Gynecology

## 2019-12-23 ENCOUNTER — Other Ambulatory Visit: Payer: Self-pay | Admitting: Obstetrics and Gynecology

## 2019-12-23 DIAGNOSIS — R634 Abnormal weight loss: Secondary | ICD-10-CM

## 2019-12-23 MED ORDER — PHENTERMINE HCL 37.5 MG PO CAPS: 37.5000 mg | ORAL_CAPSULE | ORAL | 0 refills | Status: DC

## 2019-12-23 NOTE — Telephone Encounter (Signed)
Notified patient that prescription has been sent to the pharmacy.   

## 2019-12-23 NOTE — Addendum Note (Signed)
Addended by: Finis Bud on: 12/23/2019 02:35 PM   Modules accepted: Orders

## 2019-12-23 NOTE — Telephone Encounter (Signed)
Patient called in saying her prescription for the phentermine tablets still has yet to be refilled. Patient stated that she was seen on Tuesday and is not understanding why they weren't ready the day of her appointment after she left. Patient verbalized frustrations because she stated this happens every time she needs a refill. Informed patient I understood her frustrations and I apologized to patient. Informed patient that I would sent another message to provider so that we could get this completed for her. Patient stressed that it be informed this is her second time calling this week and that she would prefer to have this completed before the end of the day. Informed patient I would let the provider know however we have 24-48 hours to reply back to telephone messages. Could you please advise?

## 2020-02-07 ENCOUNTER — Ambulatory Visit (INDEPENDENT_AMBULATORY_CARE_PROVIDER_SITE_OTHER): Payer: BC Managed Care – PPO | Admitting: Obstetrics and Gynecology

## 2020-02-07 ENCOUNTER — Other Ambulatory Visit: Payer: Self-pay

## 2020-02-07 ENCOUNTER — Encounter: Payer: Self-pay | Admitting: Obstetrics and Gynecology

## 2020-02-07 DIAGNOSIS — R634 Abnormal weight loss: Secondary | ICD-10-CM | POA: Diagnosis not present

## 2020-02-07 MED ORDER — PHENTERMINE HCL 37.5 MG PO TABS: ORAL_TABLET | ORAL | 1 refills | Status: DC

## 2020-02-07 NOTE — Progress Notes (Signed)
HPI:      [name] is a 54 y.o. No obstetric history on file. who LMP was No LMP recorded. (Menstrual status: Perimenopausal).  Subjective:   She presents today for weight loss follow-up.  She has now lost an additional 5 pounds.  She continues to modify her diet and exercise regimen.  She is understandably happy with her weight loss.    Hx: The following portions of the patient's history were reviewed and updated as appropriate:             She  has a past medical history of Gallstones, HLD (hyperlipidemia), and Hypertension. She does not have any pertinent problems on file. She  has a past surgical history that includes Tubal ligation; Uterine fibroid surgery; and Esophagogastroduodenoscopy (N/A, 09/17/2018). Her family history includes Breast cancer in her cousin; Breast cancer (age of onset: 36) in her maternal aunt; Rheum arthritis in her father. She  reports that she has never smoked. She has never used smokeless tobacco. She reports that she does not drink alcohol and does not use drugs. She has a current medication list which includes the following prescription(s): phentermine, amlodipine, atorvastatin, dicyclomine, and triamterene-hydrochlorothiazide. She is allergic to flagyl [metronidazole].       Review of Systems:  Review of Systems  Constitutional: Denied constitutional symptoms, night sweats, recent illness, fatigue, fever, insomnia and weight loss.  Eyes: Denied eye symptoms, eye pain, photophobia, vision change and visual disturbance.  Ears/Nose/Throat/Neck: Denied ear, nose, throat or neck symptoms, hearing loss, nasal discharge, sinus congestion and sore throat.  Cardiovascular: Denied cardiovascular symptoms, arrhythmia, chest pain/pressure, edema, exercise intolerance, orthopnea and palpitations.  Respiratory: Denied pulmonary symptoms, asthma, pleuritic pain, productive sputum, cough, dyspnea and wheezing.  Gastrointestinal: Denied, gastro-esophageal  reflux, melena, nausea and vomiting.  Genitourinary: Denied genitourinary symptoms including symptomatic vaginal discharge, pelvic relaxation issues, and urinary complaints.  Musculoskeletal: Denied musculoskeletal symptoms, stiffness, swelling, muscle weakness and myalgia.  Dermatologic: Denied dermatology symptoms, rash and scar.  Neurologic: Denied neurology symptoms, dizziness, headache, neck pain and syncope.  Psychiatric: Denied psychiatric symptoms, anxiety and depression.  Endocrine: Denied endocrine symptoms including hot flashes and night sweats.   Meds:   Current Outpatient Medications on File Prior to Visit  Medication Sig Dispense Refill  . amLODipine (NORVASC) 5 MG tablet Take 5 mg by mouth daily.    Marland Kitchen atorvastatin (LIPITOR) 10 MG tablet Take 10 mg by mouth daily.    Marland Kitchen dicyclomine (BENTYL) 20 MG tablet Take 1 tablet (20 mg total) by mouth 4 (four) times daily -  before meals and at bedtime. (Patient not taking: Reported on 02/07/2020) 120 tablet 0  . triamterene-hydrochlorothiazide (DYAZIDE) 37.5-25 MG capsule Take 1 capsule by mouth every morning.     No current facility-administered medications on file prior to visit.    Objective:     Vitals:   02/07/20 1424  BP: (!) 165/94  Pulse: 73                Assessment:    No obstetric history on file. Patient Active Problem List   Diagnosis Date Noted  . Gastric polyp   . Abdominal pain, generalized 09/15/2018  . HTN (hypertension) 09/15/2018  . HLD (hyperlipidemia) 09/15/2018     1. Weight loss     She continues to be successful with weight loss.   Plan:            1.  Continue phentermine.  Continued dietary restriction and exercise discussed.  Goals of at least 1 pound per week weight loss discussed.  Affirmed current successful weight loss.  Orders No orders of the defined types were placed in this encounter.    Meds ordered this encounter  Medications  . phentermine (ADIPEX-P) 37.5 MG tablet     Sig: TAKE 1 TABLET BY MOUTH DAILY BEFORE BREAKFAST    Dispense:  50 tablet    Refill:  1    Not to exceed 5 additional fills before 05/07/2020 DX Code Needed  .      F/U  No follow-ups on file. I spent 20 minutes involved in the care of this patient preparing to see the patient by obtaining and reviewing her medical history (including labs, imaging tests and prior procedures), documenting clinical information in the electronic health record (EHR), counseling and coordinating care plans, writing and sending prescriptions, ordering tests or procedures and directly communicating with the patient by discussing pertinent items from her history and physical exam as well as detailing my assessment and plan as noted above so that she has an informed understanding.  All of her questions were answered.  Finis Bud, M.D. 02/07/2020 2:36 PM

## 2020-04-10 ENCOUNTER — Encounter: Payer: BC Managed Care – PPO | Admitting: Obstetrics and Gynecology

## 2020-04-17 ENCOUNTER — Encounter: Payer: Self-pay | Admitting: Obstetrics and Gynecology

## 2020-04-17 ENCOUNTER — Ambulatory Visit (INDEPENDENT_AMBULATORY_CARE_PROVIDER_SITE_OTHER): Payer: BC Managed Care – PPO | Admitting: Obstetrics and Gynecology

## 2020-04-17 ENCOUNTER — Other Ambulatory Visit: Payer: Self-pay

## 2020-04-17 DIAGNOSIS — R634 Abnormal weight loss: Secondary | ICD-10-CM | POA: Diagnosis not present

## 2020-04-17 MED ORDER — PHENTERMINE HCL 37.5 MG PO TABS: ORAL_TABLET | ORAL | 1 refills | Status: DC

## 2020-04-17 NOTE — Progress Notes (Signed)
HPI:      [name] is a 54 y.o. No obstetric history on file. who LMP was No LMP recorded. (Menstrual status: Perimenopausal).  Subjective:   She presents today for follow-up of weight loss.  She continues to diet exercise and use phentermine.  She continues to be successful in her weight loss.    Hx: The following portions of the patient's history were reviewed and updated as appropriate:             She  has a past medical history of Gallstones, HLD (hyperlipidemia), and Hypertension. She does not have any pertinent problems on file. She  has a past surgical history that includes Tubal ligation; Uterine fibroid surgery; and Esophagogastroduodenoscopy (N/A, 09/17/2018). Her family history includes Breast cancer in her cousin; Breast cancer (age of onset: 73) in her maternal aunt; Rheum arthritis in her father. She  reports that she has never smoked. She has never used smokeless tobacco. She reports that she does not drink alcohol and does not use drugs. She has a current medication list which includes the following prescription(s): amlodipine, atorvastatin, dicyclomine, fluticasone, phentermine, amlodipine, atorvastatin, and triamterene-hydrochlorothiazide. She is allergic to flagyl [metronidazole].       Review of Systems:  Review of Systems  Constitutional: Denied constitutional symptoms, night sweats, recent illness, fatigue, fever, insomnia and weight loss.  Eyes: Denied eye symptoms, eye pain, photophobia, vision change and visual disturbance.  Ears/Nose/Throat/Neck: Denied ear, nose, throat or neck symptoms, hearing loss, nasal discharge, sinus congestion and sore throat.  Cardiovascular: Denied cardiovascular symptoms, arrhythmia, chest pain/pressure, edema, exercise intolerance, orthopnea and palpitations.  Respiratory: Denied pulmonary symptoms, asthma, pleuritic pain, productive sputum, cough, dyspnea and wheezing.  Gastrointestinal: Denied, gastro-esophageal reflux,  melena, nausea and vomiting.  Genitourinary: Denied genitourinary symptoms including symptomatic vaginal discharge, pelvic relaxation issues, and urinary complaints.  Musculoskeletal: Denied musculoskeletal symptoms, stiffness, swelling, muscle weakness and myalgia.  Dermatologic: Denied dermatology symptoms, rash and scar.  Neurologic: Denied neurology symptoms, dizziness, headache, neck pain and syncope.  Psychiatric: Denied psychiatric symptoms, anxiety and depression.  Endocrine: Denied endocrine symptoms including hot flashes and night sweats.   Meds:   Current Outpatient Medications on File Prior to Visit  Medication Sig Dispense Refill  . amLODipine (NORVASC) 5 MG tablet Take by mouth.    Marland Kitchen atorvastatin (LIPITOR) 20 MG tablet Take by mouth.    . dicyclomine (BENTYL) 20 MG tablet Take 1 tablet (20 mg total) by mouth 4 (four) times daily -  before meals and at bedtime. 120 tablet 0  . fluticasone (FLONASE) 50 MCG/ACT nasal spray Place into the nose.    Marland Kitchen amLODipine (NORVASC) 5 MG tablet Take 5 mg by mouth daily.    Marland Kitchen atorvastatin (LIPITOR) 10 MG tablet Take 10 mg by mouth daily.    Marland Kitchen triamterene-hydrochlorothiazide (DYAZIDE) 37.5-25 MG capsule Take 1 capsule by mouth every morning.     No current facility-administered medications on file prior to visit.    Objective:     Vitals:   04/17/20 1101  BP: 130/80  Pulse: 82                Assessment:    No obstetric history on file. Patient Active Problem List   Diagnosis Date Noted  . Gastric polyp   . Abdominal pain, generalized 09/15/2018  . HTN (hypertension) 09/15/2018  . HLD (hyperlipidemia) 09/15/2018     1. Weight loss     12 pound weight loss in 2 months.  Plan:            1.  Continue current management with diet exercise and phentermine.  Encourage patient. Orders No orders of the defined types were placed in this encounter.    Meds ordered this encounter  Medications  . phentermine (ADIPEX-P)  37.5 MG tablet    Sig: TAKE 1 TABLET BY MOUTH DAILY BEFORE BREAKFAST    Dispense:  50 tablet    Refill:  1    Not to exceed 5 additional fills before 05/07/2020 DX Code Needed  .      F/U  Return in about 2 months (around 06/17/2020). I spent 21 minutes involved in the care of this patient preparing to see the patient by obtaining and reviewing her medical history (including labs, imaging tests and prior procedures), documenting clinical information in the electronic health record (EHR), counseling and coordinating care plans, writing and sending prescriptions, ordering tests or procedures and directly communicating with the patient by discussing pertinent items from her history and physical exam as well as detailing my assessment and plan as noted above so that she has an informed understanding.  All of her questions were answered.  Finis Bud, M.D. 04/17/2020 11:35 AM

## 2020-06-13 ENCOUNTER — Ambulatory Visit (INDEPENDENT_AMBULATORY_CARE_PROVIDER_SITE_OTHER): Payer: BC Managed Care – PPO | Admitting: Obstetrics and Gynecology

## 2020-06-13 ENCOUNTER — Encounter: Payer: BC Managed Care – PPO | Admitting: Obstetrics and Gynecology

## 2020-06-13 ENCOUNTER — Encounter: Payer: Self-pay | Admitting: Obstetrics and Gynecology

## 2020-06-13 ENCOUNTER — Other Ambulatory Visit: Payer: Self-pay

## 2020-06-13 VITALS — BP 148/88 | HR 82 | Ht 66.0 in | Wt 218.6 lb

## 2020-06-13 DIAGNOSIS — R634 Abnormal weight loss: Secondary | ICD-10-CM | POA: Diagnosis not present

## 2020-06-13 MED ORDER — PHENTERMINE HCL 37.5 MG PO TABS: ORAL_TABLET | ORAL | 0 refills | Status: AC

## 2020-06-13 NOTE — Addendum Note (Signed)
Addended by: Finis Bud on: 06/13/2020 03:33 PM   Modules accepted: Orders

## 2020-06-13 NOTE — Progress Notes (Addendum)
HPI:      [name] is a 54 y.o. No obstetric history on file. who LMP was No LMP recorded. (Menstrual status: Perimenopausal).  Subjective:   She presents today for follow-up of weight loss.  Since September she has lost only 2 pounds and she is understandably disappointed with this.  However, overall she has lost significant weight.  She continues with dietary restriction and exercise.    Hx: The following portions of the patient's history were reviewed and updated as appropriate:             She  has a past medical history of Gallstones, HLD (hyperlipidemia), and Hypertension. She does not have any pertinent problems on file. She  has a past surgical history that includes Tubal ligation; Uterine fibroid surgery; and Esophagogastroduodenoscopy (N/A, 09/17/2018). Her family history includes Breast cancer in her cousin; Breast cancer (age of onset: 60) in her maternal aunt; Rheum arthritis in her father. She  reports that she has never smoked. She has never used smokeless tobacco. She reports that she does not drink alcohol and does not use drugs. She has a current medication list which includes the following prescription(s): amlodipine, atorvastatin, dicyclomine, fluticasone, phentermine, and triamterene-hydrochlorothiazide. She is allergic to flagyl [metronidazole].       Review of Systems:  Review of Systems  Constitutional: Denied constitutional symptoms, night sweats, recent illness, fatigue, fever, insomnia and weight loss.  Eyes: Denied eye symptoms, eye pain, photophobia, vision change and visual disturbance.  Ears/Nose/Throat/Neck: Denied ear, nose, throat or neck symptoms, hearing loss, nasal discharge, sinus congestion and sore throat.  Cardiovascular: Denied cardiovascular symptoms, arrhythmia, chest pain/pressure, edema, exercise intolerance, orthopnea and palpitations.  Respiratory: Denied pulmonary symptoms, asthma, pleuritic pain, productive sputum, cough, dyspnea  and wheezing.  Gastrointestinal: Denied, gastro-esophageal reflux, melena, nausea and vomiting.  Genitourinary: Denied genitourinary symptoms including symptomatic vaginal discharge, pelvic relaxation issues, and urinary complaints.  Musculoskeletal: Denied musculoskeletal symptoms, stiffness, swelling, muscle weakness and myalgia.  Dermatologic: Denied dermatology symptoms, rash and scar.  Neurologic: Denied neurology symptoms, dizziness, headache, neck pain and syncope.  Psychiatric: Denied psychiatric symptoms, anxiety and depression.  Endocrine: Denied endocrine symptoms including hot flashes and night sweats.   Meds:   Current Outpatient Medications on File Prior to Visit  Medication Sig Dispense Refill  . amLODipine (NORVASC) 5 MG tablet Take by mouth.    Marland Kitchen atorvastatin (LIPITOR) 20 MG tablet Take by mouth. (Patient not taking: Reported on 06/13/2020)    . dicyclomine (BENTYL) 20 MG tablet Take 1 tablet (20 mg total) by mouth 4 (four) times daily -  before meals and at bedtime. (Patient not taking: Reported on 06/13/2020) 120 tablet 0  . fluticasone (FLONASE) 50 MCG/ACT nasal spray Place into the nose. (Patient not taking: Reported on 06/13/2020)    . triamterene-hydrochlorothiazide (DYAZIDE) 37.5-25 MG capsule Take 1 capsule by mouth every morning.     No current facility-administered medications on file prior to visit.          Objective:     Vitals:   06/13/20 1509  BP: (!) 148/88  Pulse: 82   Filed Weights   06/13/20 1509  Weight: 218 lb 9.6 oz (99.2 kg)                Assessment:    No obstetric history on file. Patient Active Problem List   Diagnosis Date Noted  . Gastric polyp   . Abdominal pain, generalized 09/15/2018  . HTN (hypertension) 09/15/2018  .  HLD (hyperlipidemia) 09/15/2018     1. Weight loss     Patient appears to have plateaued in her weight loss despite continuing diet and exercise.   Plan:            1.  Follow-up in 1 month.   Expectation of 1 pound per week discussed with patient.  Encouraged continued diet and exercise.  Discussed holiday dietary modification because of Thanksgiving and Christmas coming right up. Orders No orders of the defined types were placed in this encounter.    Meds ordered this encounter  Medications  . phentermine (ADIPEX-P) 37.5 MG tablet    Sig: TAKE 1 TABLET BY MOUTH DAILY BEFORE BREAKFAST    Dispense:  50 tablet    Refill:  0    Not to exceed 5 additional fills before 05/07/2020 DX Code Needed  .      F/U  Return in about 1 month (around 07/13/2020). I spent 20 minutes involved in the care of this patient preparing to see the patient by obtaining and reviewing her medical history (including labs, imaging tests and prior procedures), documenting clinical information in the electronic health record (EHR), counseling and coordinating care plans, writing and sending prescriptions, ordering tests or procedures and directly communicating with the patient by discussing pertinent items from her history and physical exam as well as detailing my assessment and plan as noted above so that she has an informed understanding.  All of her questions were answered.  Finis Bud, M.D. 06/13/2020 3:33 PM

## 2020-07-17 ENCOUNTER — Encounter: Payer: BC Managed Care – PPO | Admitting: Obstetrics and Gynecology

## 2020-07-26 ENCOUNTER — Encounter: Payer: Self-pay | Admitting: Obstetrics and Gynecology

## 2020-09-19 ENCOUNTER — Encounter: Payer: BC Managed Care – PPO | Admitting: Obstetrics and Gynecology

## 2020-09-26 ENCOUNTER — Other Ambulatory Visit: Payer: Self-pay

## 2020-09-26 ENCOUNTER — Encounter: Payer: Self-pay | Admitting: Obstetrics and Gynecology

## 2020-09-26 ENCOUNTER — Ambulatory Visit (INDEPENDENT_AMBULATORY_CARE_PROVIDER_SITE_OTHER): Payer: Commercial Managed Care - PPO | Admitting: Obstetrics and Gynecology

## 2020-09-26 VITALS — BP 154/88 | HR 75 | Ht 65.0 in | Wt 222.0 lb

## 2020-09-26 DIAGNOSIS — R634 Abnormal weight loss: Secondary | ICD-10-CM | POA: Diagnosis not present

## 2020-09-26 NOTE — Progress Notes (Signed)
HPI:      [name] is a 55 y.o. [id number] who LMP was No LMP recorded (lmp unknown). (Menstrual status: Perimenopausal).  Subjective:   She presents today to further discuss weight loss.  She stopped using Adipex in December.  She was unable to keep her appointment in December.  She says she has been under a lot of stress with multiple deaths in the family.  Despite this she has been able to maintain weight. She would like to continue losing weight but says that she would like to try it on her own for the best long-term benefit.    Hx: The following portions of the patient's history were reviewed and updated as appropriate:             She  has a past medical history of Gallstones, HLD (hyperlipidemia), and Hypertension. She does not have any pertinent problems on file. She  has a past surgical history that includes Tubal ligation; Uterine fibroid surgery; and Esophagogastroduodenoscopy (N/A, 09/17/2018). Her family history includes Breast cancer in her cousin; Breast cancer (age of onset: 21) in her maternal aunt; Rheum arthritis in her father. She  reports that she has never smoked. She has never used smokeless tobacco. She reports that she does not drink alcohol and does not use drugs. She has a current medication list which includes the following prescription(s): amlodipine, phentermine, and triamterene-hydrochlorothiazide. She is allergic to flagyl [metronidazole].       Review of Systems:  Review of Systems  Constitutional: Denied constitutional symptoms, night sweats, recent illness, fatigue, fever, insomnia and weight loss.  Eyes: Denied eye symptoms, eye pain, photophobia, vision change and visual disturbance.  Ears/Nose/Throat/Neck: Denied ear, nose, throat or neck symptoms, hearing loss, nasal discharge, sinus congestion and sore throat.  Cardiovascular: Denied cardiovascular symptoms, arrhythmia, chest pain/pressure, edema, exercise intolerance, orthopnea and  palpitations.  Respiratory: Denied pulmonary symptoms, asthma, pleuritic pain, productive sputum, cough, dyspnea and wheezing.  Gastrointestinal: Denied, gastro-esophageal reflux, melena, nausea and vomiting.  Genitourinary: Denied genitourinary symptoms including symptomatic vaginal discharge, pelvic relaxation issues, and urinary complaints.  Musculoskeletal: Denied musculoskeletal symptoms, stiffness, swelling, muscle weakness and myalgia.  Dermatologic: Denied dermatology symptoms, rash and scar.  Neurologic: Denied neurology symptoms, dizziness, headache, neck pain and syncope.  Psychiatric: Denied psychiatric symptoms, anxiety and depression.  Endocrine: Denied endocrine symptoms including hot flashes and night sweats.   Meds:   Current Outpatient Medications on File Prior to Visit  Medication Sig Dispense Refill  . amLODipine (NORVASC) 5 MG tablet Take by mouth.    . phentermine (ADIPEX-P) 37.5 MG tablet TAKE 1 TABLET BY MOUTH DAILY BEFORE BREAKFAST 50 tablet 0  . triamterene-hydrochlorothiazide (DYAZIDE) 37.5-25 MG capsule Take 1 capsule by mouth every morning.     No current facility-administered medications on file prior to visit.          Objective:     Vitals:   09/26/20 1115  BP: (!) 154/88  Pulse: 75   Filed Weights   09/26/20 1115  Weight: 222 lb (100.7 kg)                Assessment:    [id number] Patient Active Problem List   Diagnosis Date Noted  . Gastric polyp   . Abdominal pain, generalized 09/15/2018  . HTN (hypertension) 09/15/2018  . HLD (hyperlipidemia) 09/15/2018     1. Weight loss     Patient discontinued Adipex use and did not keep her appointment.  Has maintained weight since discontinuation  of Adipex despite increased stress in her life.   Plan:            1.  She plans to rededicate herself to weight loss including exercise and diet.  2.  Offered referral to weight loss center and patient declined.  3.  Discussed weight watchers  and other programs similar. Orders No orders of the defined types were placed in this encounter.   No orders of the defined types were placed in this encounter.     F/U  Return for Annual Physical. I spent 15 minutes involved in the care of this patient preparing to see the patient by obtaining and reviewing her medical history (including labs, imaging tests and prior procedures), documenting clinical information in the electronic health record (EHR), counseling and coordinating care plans, writing and sending prescriptions, ordering tests or procedures and directly communicating with the patient by discussing pertinent items from her history and physical exam as well as detailing my assessment and plan as noted above so that she has an informed understanding.  All of her questions were answered.  Finis Bud, M.D. 09/26/2020 11:46 AM

## 2021-11-05 IMAGING — MG DIGITAL SCREENING BILAT W/ TOMO W/ CAD
8 series · 8 of 24 positions shown · non-contrast
Comparison: Previous exam(s).

CLINICAL DATA: Screening.

EXAM:
DIGITAL SCREENING BILATERAL MAMMOGRAM WITH TOMO AND CAD

[R MLO synth-2D (1 of 2)]
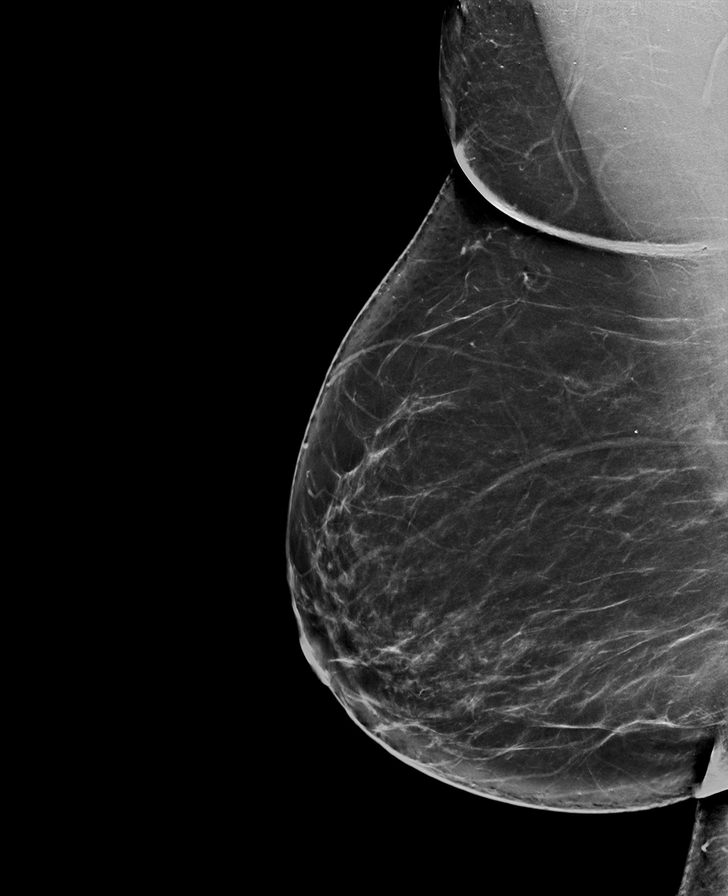

[L CC synth-2D]
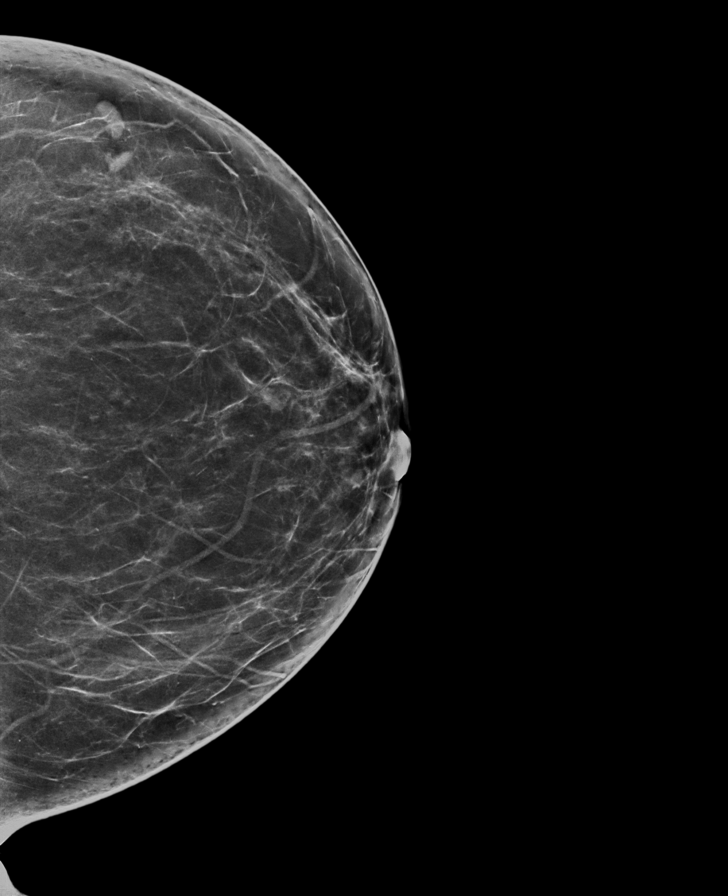

[R MLO synth-2D (2 of 2)]
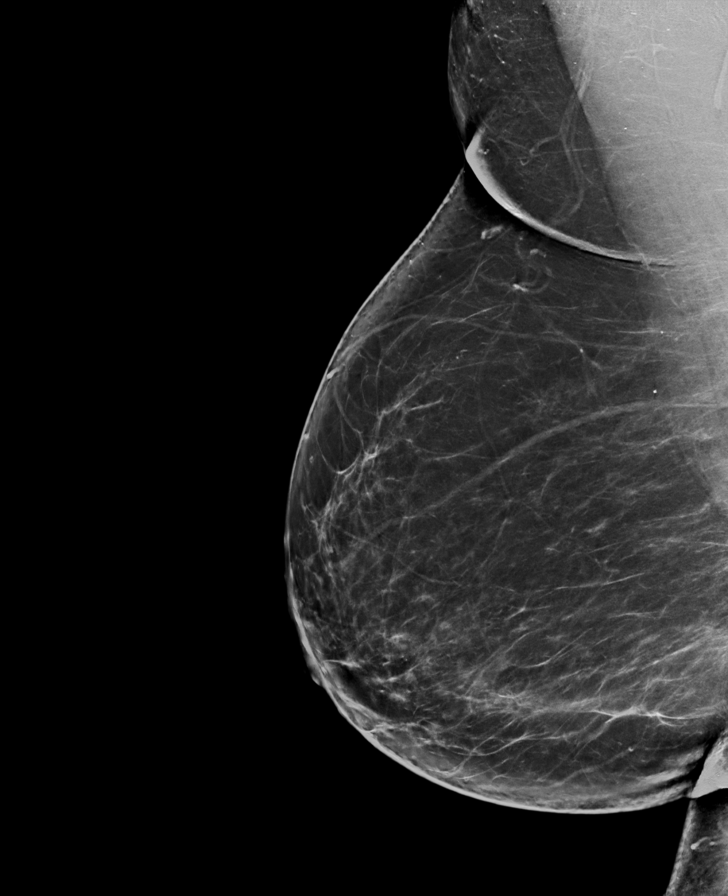

[R CC synth-2D]
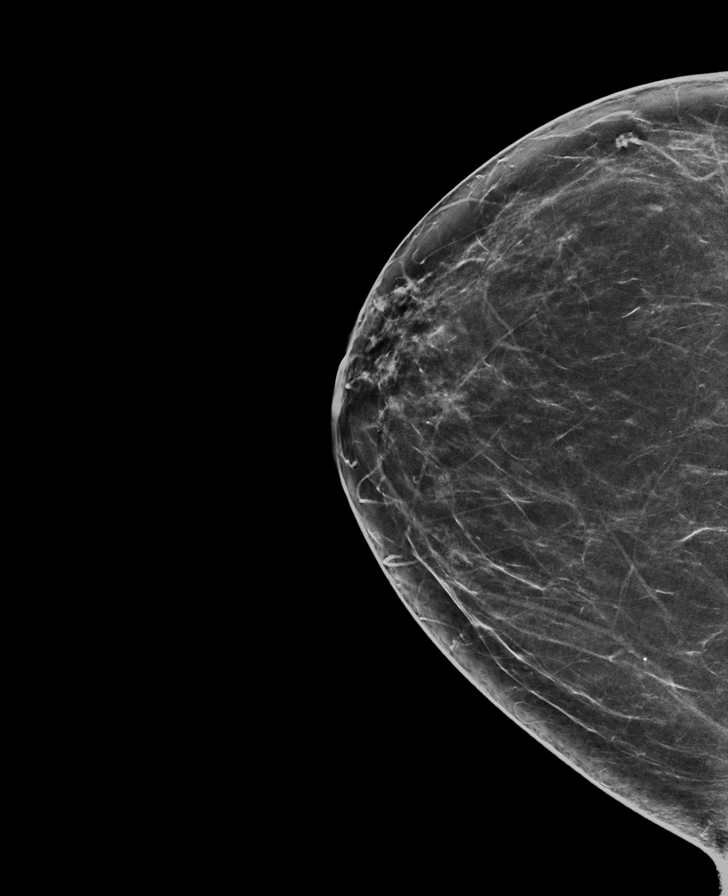

[L CC tomo · tomo slice 40/79.0]
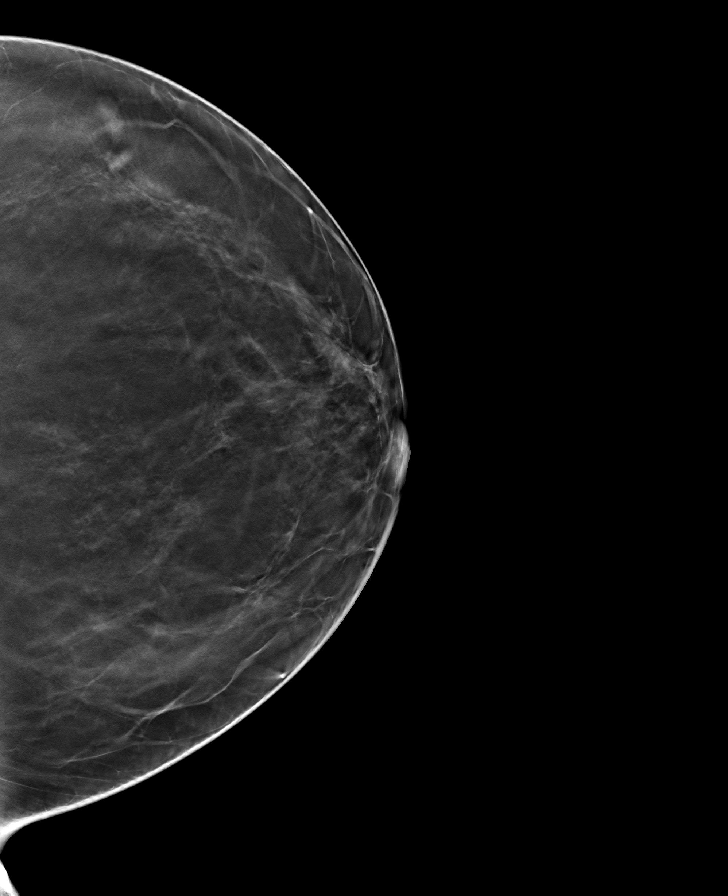

[L MLO tomo · tomo slice 45/90.0]
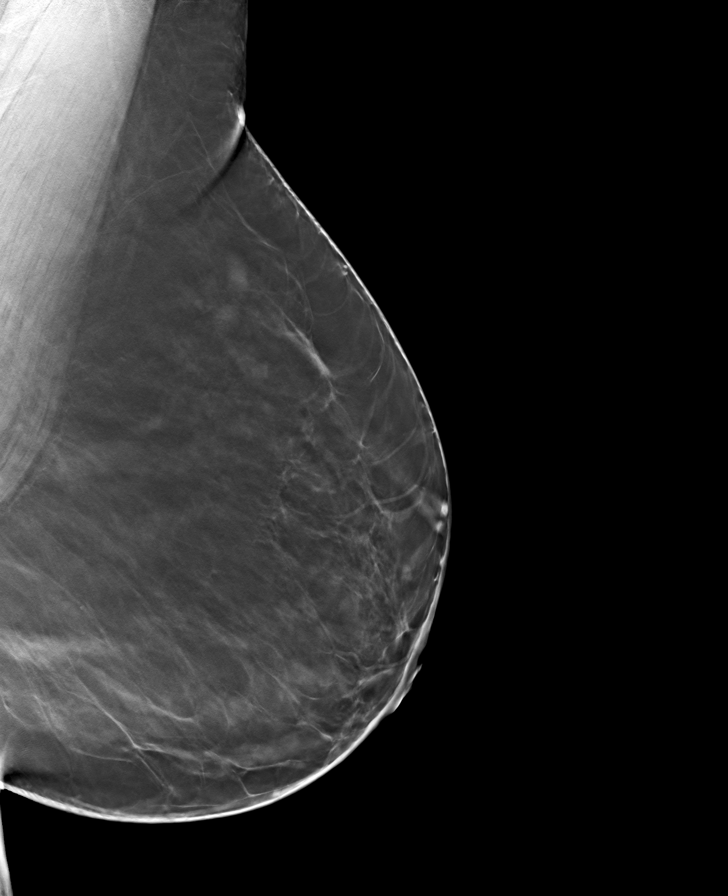

[R CC tomo · tomo slice 37/73.0]
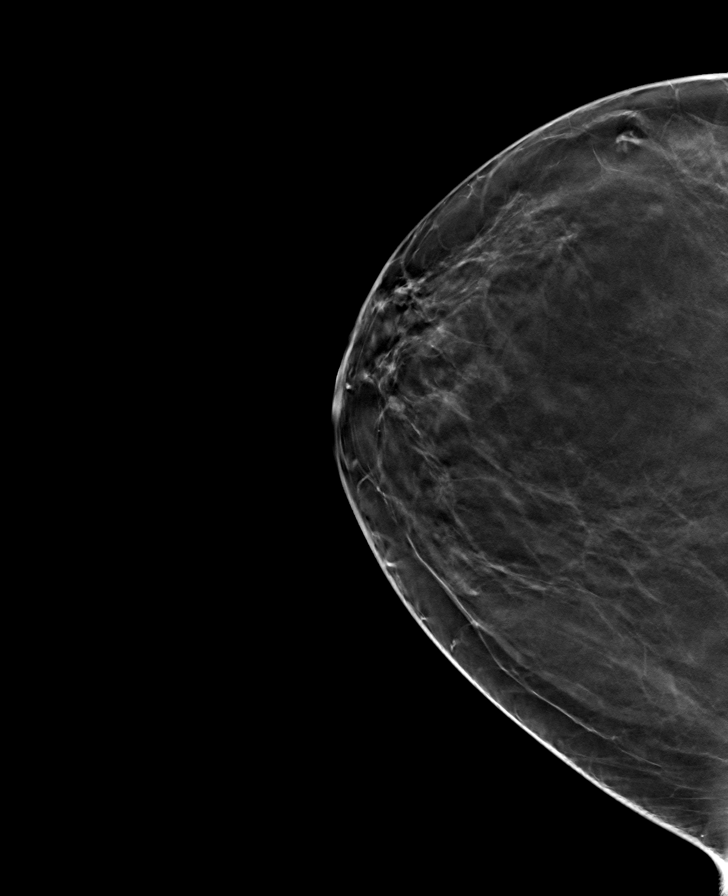

[R MLO tomo · tomo slice 46/91.0]
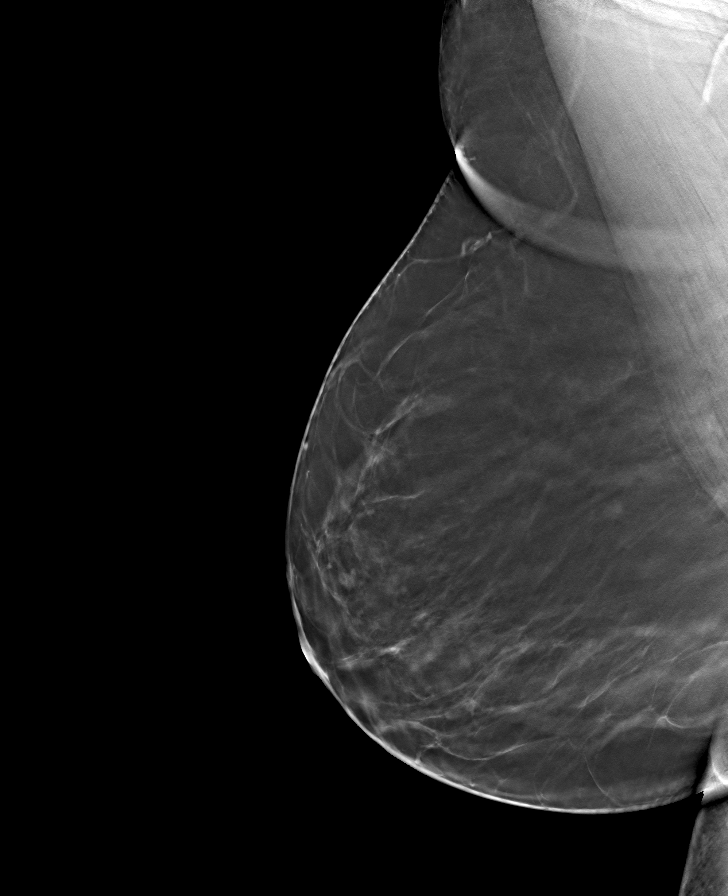

[8 of 24 positions shown; findings below may reference images not displayed]

ACR Breast Density Category b: There are scattered areas of
fibroglandular density.
FINDINGS: In the left breast, a possible mass warrants further evaluation. In
the right breast, no findings suspicious for malignancy.

Images were processed with CAD.
IMPRESSION: Further evaluation is suggested for possible mass in the left
breast.

RECOMMENDATION:
Diagnostic mammogram and possibly ultrasound of the left breast.
(Code:JC-2-SSL)

The patient will be contacted regarding the findings, and additional
imaging will be scheduled.

BI-RADS CATEGORY  0: Incomplete. Need additional imaging evaluation
and/or prior mammograms for comparison.

## 2022-03-10 ENCOUNTER — Other Ambulatory Visit: Payer: Self-pay | Admitting: Family Medicine

## 2022-03-10 DIAGNOSIS — Z1231 Encounter for screening mammogram for malignant neoplasm of breast: Secondary | ICD-10-CM

## 2022-04-02 ENCOUNTER — Ambulatory Visit: Payer: BC Managed Care – PPO

## 2022-05-29 ENCOUNTER — Ambulatory Visit
Admission: RE | Admit: 2022-05-29 | Discharge: 2022-05-29 | Disposition: A | Discharge status: Home / Self Care | Payer: BC Managed Care – PPO | Source: Ambulatory Visit | Attending: Family Medicine | Admitting: Family Medicine

## 2022-05-29 DIAGNOSIS — Z1231 Encounter for screening mammogram for malignant neoplasm of breast: Secondary | ICD-10-CM | POA: Insufficient documentation

## 2023-06-29 ENCOUNTER — Other Ambulatory Visit: Payer: Self-pay | Admitting: Family Medicine

## 2023-06-29 DIAGNOSIS — Z1231 Encounter for screening mammogram for malignant neoplasm of breast: Secondary | ICD-10-CM

## 2023-07-30 ENCOUNTER — Ambulatory Visit
Admission: RE | Admit: 2023-07-30 | Discharge: 2023-07-30 | Disposition: A | Discharge status: Home / Self Care | Payer: Commercial Managed Care - PPO | Source: Ambulatory Visit | Attending: Family Medicine | Admitting: Family Medicine

## 2023-07-30 DIAGNOSIS — Z1231 Encounter for screening mammogram for malignant neoplasm of breast: Secondary | ICD-10-CM | POA: Diagnosis present
